# Patient Record
Sex: Female | Born: 1970 | Race: White | Hispanic: No
Health system: Southern US, Community
[De-identification: ages and names within clinical notes are randomized; demographics above are authoritative.]

## PROBLEM LIST (undated history)

## (undated) DIAGNOSIS — I639 Cerebral infarction, unspecified: Secondary | ICD-10-CM

---

## 2007-10-18 ENCOUNTER — Emergency Department (HOSPITAL_COMMUNITY): Admission: EM | Admit: 2007-10-18 | Discharge: 2007-10-18 | Payer: Self-pay | Admitting: Emergency Medicine

## 2010-05-02 ENCOUNTER — Emergency Department (HOSPITAL_COMMUNITY): Payer: BC Managed Care – PPO

## 2010-05-02 ENCOUNTER — Inpatient Hospital Stay (HOSPITAL_COMMUNITY)
Admission: EM | Admit: 2010-05-02 | Discharge: 2010-05-05 | DRG: 530 | Disposition: A | Discharge status: Home / Self Care | Payer: BC Managed Care – PPO | Attending: Neurology | Admitting: Neurology

## 2010-05-02 ENCOUNTER — Inpatient Hospital Stay (HOSPITAL_COMMUNITY): Payer: BC Managed Care – PPO

## 2010-05-02 DIAGNOSIS — Z7982 Long term (current) use of aspirin: Secondary | ICD-10-CM

## 2010-05-02 DIAGNOSIS — Q211 Atrial septal defect: Secondary | ICD-10-CM

## 2010-05-02 DIAGNOSIS — K219 Gastro-esophageal reflux disease without esophagitis: Secondary | ICD-10-CM | POA: Diagnosis present

## 2010-05-02 DIAGNOSIS — J9600 Acute respiratory failure, unspecified whether with hypoxia or hypercapnia: Secondary | ICD-10-CM

## 2010-05-02 DIAGNOSIS — R29898 Other symptoms and signs involving the musculoskeletal system: Secondary | ICD-10-CM | POA: Diagnosis present

## 2010-05-02 DIAGNOSIS — R4701 Aphasia: Secondary | ICD-10-CM | POA: Diagnosis present

## 2010-05-02 DIAGNOSIS — G510 Bell's palsy: Secondary | ICD-10-CM | POA: Diagnosis present

## 2010-05-02 DIAGNOSIS — Q2111 Secundum atrial septal defect: Secondary | ICD-10-CM

## 2010-05-02 DIAGNOSIS — J95821 Acute postprocedural respiratory failure: Secondary | ICD-10-CM | POA: Diagnosis not present

## 2010-05-02 DIAGNOSIS — I6340 Cerebral infarction due to embolism of unspecified cerebral artery: Principal | ICD-10-CM | POA: Diagnosis present

## 2010-05-02 DIAGNOSIS — Z880 Allergy status to penicillin: Secondary | ICD-10-CM

## 2010-05-02 DIAGNOSIS — E785 Hyperlipidemia, unspecified: Secondary | ICD-10-CM | POA: Diagnosis present

## 2010-05-02 DIAGNOSIS — F17200 Nicotine dependence, unspecified, uncomplicated: Secondary | ICD-10-CM | POA: Diagnosis present

## 2010-05-02 DIAGNOSIS — I6350 Cerebral infarction due to unspecified occlusion or stenosis of unspecified cerebral artery: Secondary | ICD-10-CM

## 2010-05-02 LAB — HEMOGLOBIN A1C: Mean Plasma Glucose: 100 mg/dL (ref <117)

## 2010-05-02 LAB — DIFFERENTIAL
Basophils Absolute: 0.1 10*3/uL (ref 0.0–0.1)
Eosinophils Relative: 3 % (ref 0–5)
Monocytes Absolute: 0.5 10*3/uL (ref 0.1–1.0)
Neutro Abs: 4.1 10*3/uL (ref 1.7–7.7)
Neutrophils Relative %: 58 % (ref 43–77)

## 2010-05-02 LAB — CBC
HCT: 39.2 % (ref 36.0–46.0)
Hemoglobin: 13.6 g/dL (ref 12.0–15.0)
MCH: 30.4 pg (ref 26.0–34.0)
RBC: 4.47 MIL/uL (ref 3.87–5.11)
WBC: 7 10*3/uL (ref 4.0–10.5)

## 2010-05-02 LAB — LIPID PANEL
HDL: 46 mg/dL (ref >39)
LDL Cholesterol: 103 mg/dL — ABNORMAL HIGH (ref 0–99)
Triglycerides: 29 mg/dL (ref <150)

## 2010-05-02 LAB — COMPREHENSIVE METABOLIC PANEL
AST: 18 U/L (ref 0–37)
BUN: 9 mg/dL (ref 6–23)
Calcium: 9.6 mg/dL (ref 8.4–10.5)
GFR calc non Af Amer: >60 mL/min (ref >60)
Glucose, Bld: 104 mg/dL — ABNORMAL HIGH (ref 70–99)
Potassium: 3.9 mEq/L (ref 3.5–5.1)
Sodium: 140 mEq/L (ref 135–145)
Total Bilirubin: 0.9 mg/dL (ref 0.3–1.2)

## 2010-05-02 LAB — PROTIME-INR
INR: 0.93 (ref 0.00–1.49)
Prothrombin Time: 12.7 seconds (ref 11.6–15.2)

## 2010-05-02 LAB — RAPID URINE DRUG SCREEN, HOSP PERFORMED
Amphetamines: NOT DETECTED
Barbiturates: NOT DETECTED

## 2010-05-02 LAB — CK TOTAL AND CKMB (NOT AT ARMC)
CK, MB: 1.1 ng/mL (ref 0.3–4.0)
Relative Index: INVALID (ref 0.0–2.5)
Total CK: 68 U/L (ref 7–177)

## 2010-05-02 LAB — BLOOD GAS, ARTERIAL
Acid-base deficit: 2.1 mmol/L — ABNORMAL HIGH (ref 0.0–2.0)
Bicarbonate: 22 mEq/L (ref 20.0–24.0)
FIO2: 0.4 %
O2 Saturation: 99.6 %
PEEP: 5 cmH2O
Patient temperature: 98.6
TCO2: 23.1 mmol/L (ref 0–100)
pO2, Arterial: 214 mmHg — ABNORMAL HIGH (ref 80.0–100.0)

## 2010-05-02 LAB — URINALYSIS, MICROSCOPIC ONLY
Bilirubin Urine: NEGATIVE
Glucose, UA: 100 mg/dL — AB
Ketones, ur: 40 mg/dL — AB
Leukocytes, UA: NEGATIVE
Nitrite: NEGATIVE
Protein, ur: NEGATIVE mg/dL

## 2010-05-02 LAB — GLUCOSE, CAPILLARY
Glucose-Capillary: 126 mg/dL — ABNORMAL HIGH (ref 70–99)
Glucose-Capillary: 139 mg/dL — ABNORMAL HIGH (ref 70–99)

## 2010-05-02 LAB — POCT I-STAT, CHEM 8
BUN: 10 mg/dL (ref 6–23)
Potassium: 3.9 mEq/L (ref 3.5–5.1)
Sodium: 138 mEq/L (ref 135–145)

## 2010-05-02 LAB — APTT: aPTT: 28 seconds (ref 24–37)

## 2010-05-02 MED ORDER — IOHEXOL 300 MG/ML  SOLN
300.0000 mL | Freq: Once | INTRAMUSCULAR | Status: AC | PRN
Start: 2010-05-02 — End: 2010-05-02
  Administered 2010-05-02: 125 mL via INTRA_ARTERIAL

## 2010-05-03 ENCOUNTER — Inpatient Hospital Stay (HOSPITAL_COMMUNITY): Payer: BC Managed Care – PPO

## 2010-05-03 DIAGNOSIS — I6789 Other cerebrovascular disease: Secondary | ICD-10-CM

## 2010-05-03 DIAGNOSIS — I6350 Cerebral infarction due to unspecified occlusion or stenosis of unspecified cerebral artery: Secondary | ICD-10-CM

## 2010-05-03 LAB — GLUCOSE, CAPILLARY
Glucose-Capillary: 85 mg/dL (ref 70–99)
Glucose-Capillary: 90 mg/dL (ref 70–99)

## 2010-05-03 LAB — BASIC METABOLIC PANEL
BUN: 8 mg/dL (ref 6–23)
Calcium: 7.2 mg/dL — ABNORMAL LOW (ref 8.4–10.5)
Chloride: 112 mEq/L (ref 96–112)
Creatinine, Ser: 0.61 mg/dL (ref 0.4–1.2)
GFR calc Af Amer: >60 mL/min (ref >60)

## 2010-05-03 LAB — CBC
Platelets: 203 10*3/uL (ref 150–400)
RBC: 3.5 MIL/uL — ABNORMAL LOW (ref 3.87–5.11)
RDW: 12.5 % (ref 11.5–15.5)
WBC: 15.2 10*3/uL — ABNORMAL HIGH (ref 4.0–10.5)

## 2010-05-03 LAB — MAGNESIUM: Magnesium: 1.8 mg/dL (ref 1.5–2.5)

## 2010-05-03 LAB — SEDIMENTATION RATE: Sed Rate: 14 mm/hr (ref 0–22)

## 2010-05-03 LAB — PHOSPHORUS: Phosphorus: 3 mg/dL (ref 2.3–4.6)

## 2010-05-03 LAB — PROTIME-INR: INR: 1.1 (ref 0.00–1.49)

## 2010-05-03 LAB — HEPARIN LEVEL (UNFRACTIONATED): Heparin Unfractionated: <0.1 IU/mL — ABNORMAL LOW (ref 0.30–0.70)

## 2010-05-04 LAB — GLUCOSE, CAPILLARY: Glucose-Capillary: 80 mg/dL (ref 70–99)

## 2010-05-04 LAB — C4 COMPLEMENT: Complement C4, Body Fluid: 19 mg/dL (ref 16–47)

## 2010-05-04 LAB — C3 COMPLEMENT: C3 Complement: 99 mg/dL (ref 88–201)

## 2010-05-04 LAB — HOMOCYSTEINE: Homocysteine: 8.7 umol/L (ref 4.0–15.4)

## 2010-05-04 NOTE — H&P (Signed)
NAMEKAYZLEE, WIRTANEN             ACCOUNT NO.:  [id number]  MEDICAL RECORD NO.:  [id number]           PATIENT TYPE:  I  LOCATION:  3102                         FACILITY:  [facility]  PHYSICIAN:  Thana Farr, MD    DATE OF BIRTH:  1970/03/08  DATE OF ADMISSION:  05/02/2010 DATE OF DISCHARGE:                             HISTORY & PHYSICAL     ADMISSION DIAGNOSES:  Left hemispheric stroke.  HISTORY OF PRESENT ILLNESS:  Ms. Mayberry is a 40 year old female that awakened this morning normal.  It was noted by her husband at approximately 8 o'clock to have difficulty with speech.  When attempted to stand on, the patient was weak and unable to move her right.  EMS was called at that time.  The patient was brought in as a code stroke. Initial NIH stroke scale was 13.  PAST MEDICAL HISTORY:  Bell palsy.  MEDICATIONS:  None.  ALLERGIES:  No known drug allergies.  FAMILY HISTORY:  The patient's parents are alive and well.  SOCIAL HISTORY:  The patient is a Social research officer, government.  She smokes a pack a week of cigarettes.  Drinks on occasion.  There is no history of illicit drug abuse.  PHYSICAL EXAMINATION:  VITAL SIGNS:  Blood pressure 130/70, heart rate 78, respiratory rate 19, temperature 98.3, O2 sat 99% on room air. NEUROLOGIC:  On mental status testing, the patient is awake.  Speech is dysarthric.  She understands commands at times and other times she does not understand commands and is unable to follow them.  On cranial nerve testing II, disks flat bilaterally.  The patient does not blink to light confrontation.  III, IV, and VI:  Extraocular movements intact.  Pupils reactive bilaterally.  V and VII:  Mild right facial droop.  VIII: Grossly intact.  IX and X:  Decreased gag.  XI and XII:  Tongue deviation to the right on extension.  On motor testing, the patient has 1-2/5 strength in the right upper extremity, 3-/5 in the right lower extremity.  The patient is 5/5 on the left.  On  sensory testing, the patient responds to noxious stimuli bilaterally.  Deep tendon reflexes are 2+ throughout.  Plantars are downgoing on the left and upgoing on the right.  Cerebellar testing is unable to be performed.  TEST RESULTS:  Sodium of 139, potassium of 3.9, chloride 105, bicarb 23, BUN and creatinine 10 and 0.9 respectively, glucose 108, hemoglobin/hematocrit 13.9 and 41.0 respectively.  EKG shows normal sinus rhythm.  CT shows hyperdense left MCA sign.  ASSESSMENT:  Ms. Clayborn is a 40 year old female with acute onset of aphasia and right-sided weakness.  She has a dense middle cerebral artery sign on CT on the left and Dr. Corliss Skains was contacted from Interventional Radiology.  It was determined that the patient would go directly to interventional for a diagnostic angiogram and possible intervention.  PLAN: 1. The patient received two-thirds t-PA now. 2. The patient will be transferred to Interventional Radiology with t-     PA going.  Further intervention to be determined once procedures     performed.  ______________________________ Thana Farr, MD     LR/MEDQ  D:  05/02/2010  T:  05/02/2010  Job:  [id number]  Electronically Signed by Thana Farr MD on 05/04/2010 03:13:10 PM

## 2010-05-05 LAB — GLUCOSE, CAPILLARY: Glucose-Capillary: 87 mg/dL (ref 70–99)

## 2010-05-06 LAB — LUPUS ANTICOAGULANT PANEL
DRVVT: 44.2 secs (ref 36.2–44.3)
Lupus Anticoagulant: NOT DETECTED

## 2010-05-06 LAB — PROTEIN S ACTIVITY: Protein S Activity: 118 % (ref 69–129)

## 2010-05-06 LAB — PROTHROMBIN GENE MUTATION

## 2010-05-06 LAB — PROTEIN C, TOTAL: Protein C, Total: 114 % (ref 72–160)

## 2010-05-06 LAB — PROTEIN C ACTIVITY: Protein C Activity: 176 % — ABNORMAL HIGH (ref 75–133)

## 2010-05-06 LAB — ANA: Anti Nuclear Antibody(ANA): NEGATIVE

## 2010-05-07 LAB — CARDIOLIPIN ANTIBODIES, IGG, IGM, IGA
Anticardiolipin IgG: 5 GPL U/mL — ABNORMAL LOW (ref <23)
Anticardiolipin IgM: 2 MPL U/mL — ABNORMAL LOW (ref <11)

## 2010-05-11 NOTE — Discharge Summary (Signed)
NAMEPERRI, ARAGONES             ACCOUNT NO.:  [id number]  MEDICAL RECORD NO.:  [id number]           PATIENT TYPE:  I  LOCATION:  3021                         FACILITY:  [facility]  PHYSICIAN:  Gamble Enderle P. Pearlean Brownie, MD    DATE OF BIRTH:  Jan 21, 1970  DATE OF ADMISSION:  05/02/2010 DATE OF DISCHARGE:  05/05/2010                              DISCHARGE SUMMARY   ADMISSION DIAGNOSIS:  Left hemispheric infarct.  DISCHARGE DIAGNOSES: 1. 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 2. Mild dyslipidemia. 3. Patent foramen ovale.  HOSPITAL COURSE:  Ms. Kohen is a 40 year old Caucasian lady who developed sudden onset of speech difficulties along with right-sided weakness while talking to her husband.  She presented emergently to [facility] and was found to have initial NIH stroke scale of 13.  CT scan of the head showed a dense left middle cerebral artery sign with no acute abnormalities.  The patient was given two-thirds dose of IV tPA and taken emergently to radiology catheter angiogram which showed occlusion of the left middle cerebral artery in the M1 segment.  After written informed consent was obtained from the patient's husband and family and risks and benefits of acute endovascular stroke treatment were explained, the patient underwent endovascular treatment with Dr. Corliss Skains.  She was given 10 mg of intraarterial tPA along with mechanical thrombectomy with one pass of Solitaire device, which resulted in complete recanalization.  Postprocedure, CT scan showed no evidence of hemorrhage.  The patient was admitted to the Neurological Intensive Care Unit where blood pressure was  tightly controlled.  She was found to be awake and following commands.  She was extubated and the groin sheet was  discontinued.  She was found to have significant improvement in her right-sided strength as well as speech.  Rest of her stroke workup showed urine drug screen was negative except for benzodiazepine.  Hemoglobin A1c was normal at 5.1.  Total cholesterol was normal at 105, triglycerides 29, HDL 46, and LDL was borderline at 103 mg percent.  Admission electrolytes, white count were normal. Hypercoagulable panel labs were sent, but all of the results were not back at the time of discharge.  Only, antithrombin III and homocystine were normal.  RPR was negative.  HIV was negative.  Complement levels and ESR were normal.  The patient underwent transesophageal echocardiogram which showed normal ejection fraction, but showed a small patent foramen ovale.  Lower extremity venous Doppler was obtained which showed no evidence of deep vein thrombosis.  Carotid ultrasound showed no significant extracranial stenosis.  The etiology of the patient's stroke was determined to be cryptogenic, but since the patient remained excellent clinical recovery she was seen by Physical Therapy and was thought to require only outpatient therapy needs.  She was started on  aspirin for stroke prevention as well as on Crestor 2.5 mg a day.  She was discharged home in stable condition and advised to follow up with Dr. Pearlean Brownie as an outpatient in 2-3 weeks for transcranial Doppler bubble study and emboli monitoring and at that time arrangements will be made for outpatient prolonged cardiac telemetry as well.  She was advised to resume work in about 2 weeks.  DISCHARGE MEDICATIONS: 1. Aspirin 325 mg a day. 2. Crestor 2.5 mg a day.  At the time of discharge, NIH stroke scale had improved from 13 on the admission to 1 with only occasional word hesitancy and word-finding difficulty and no focal weakness on right  side.     Notnamed Scholz P. Pearlean Brownie, MD     PPS/MEDQ  D:  05/05/2010  T:  05/05/2010  Job:  [id number]  Electronically Signed by Delia Heady MD on 05/11/2010 01:50:43 PM

## 2021-02-13 ENCOUNTER — Encounter (HOSPITAL_COMMUNITY): Payer: Self-pay | Admitting: Emergency Medicine

## 2021-02-13 ENCOUNTER — Emergency Department (HOSPITAL_COMMUNITY)
Admission: EM | Admit: 2021-02-13 | Discharge: 2021-02-13 | Disposition: A | Discharge status: Home / Self Care | Payer: BC Managed Care – PPO | Attending: Emergency Medicine | Admitting: Emergency Medicine

## 2021-02-13 ENCOUNTER — Emergency Department (HOSPITAL_COMMUNITY): Payer: BC Managed Care – PPO

## 2021-02-13 ENCOUNTER — Other Ambulatory Visit: Payer: Self-pay

## 2021-02-13 DIAGNOSIS — R531 Weakness: Secondary | ICD-10-CM | POA: Diagnosis not present

## 2021-02-13 DIAGNOSIS — Z7982 Long term (current) use of aspirin: Secondary | ICD-10-CM | POA: Insufficient documentation

## 2021-02-13 DIAGNOSIS — R208 Other disturbances of skin sensation: Secondary | ICD-10-CM | POA: Diagnosis not present

## 2021-02-13 DIAGNOSIS — M5412 Radiculopathy, cervical region: Secondary | ICD-10-CM | POA: Diagnosis not present

## 2021-02-13 DIAGNOSIS — R0789 Other chest pain: Secondary | ICD-10-CM | POA: Diagnosis present

## 2021-02-13 HISTORY — DX: Cerebral infarction, unspecified: I63.9

## 2021-02-13 LAB — CBC
HCT: 37 % (ref 36.0–46.0)
Hemoglobin: 12.4 g/dL (ref 12.0–15.0)
MCH: 29.7 pg (ref 26.0–34.0)
MCHC: 33.5 g/dL (ref 30.0–36.0)
MCV: 88.7 fL (ref 80.0–100.0)
Platelets: 255 10*3/uL (ref 150–400)
RBC: 4.17 MIL/uL (ref 3.87–5.11)
RDW: 12.2 % (ref 11.5–15.5)
WBC: 8 10*3/uL (ref 4.0–10.5)
nRBC: 0 % (ref 0.0–0.2)

## 2021-02-13 LAB — BASIC METABOLIC PANEL
Anion gap: 10 (ref 5–15)
BUN: 9 mg/dL (ref 6–20)
CO2: 24 mmol/L (ref 22–32)
Calcium: 8.7 mg/dL — ABNORMAL LOW (ref 8.9–10.3)
Chloride: 102 mmol/L (ref 98–111)
Creatinine, Ser: 0.69 mg/dL (ref 0.44–1.00)
GFR, Estimated: >60 mL/min (ref >60)
Glucose, Bld: 91 mg/dL (ref 70–99)
Potassium: 3.9 mmol/L (ref 3.5–5.1)
Sodium: 136 mmol/L (ref 135–145)

## 2021-02-13 LAB — TROPONIN I (HIGH SENSITIVITY)
Troponin I (High Sensitivity): 3 ng/L (ref <18)
Troponin I (High Sensitivity): 3 ng/L (ref <18)

## 2021-02-13 MED ORDER — PREDNISONE 50 MG PO TABS: 50.0000 mg | ORAL_TABLET | Freq: Every day | ORAL | 0 refills | Status: AC

## 2021-02-13 NOTE — ED Triage Notes (Signed)
Patient complains of left arm tingling and chest tightness that started a few days, pain is worse when laying down. Patient denies nausea, vomiting, and shortness of breath. Patient is alert, oriented, and in no apparent distress at this time.

## 2021-02-13 NOTE — ED Provider Triage Note (Signed)
Emergency Medicine Provider Triage Evaluation Note  Kaylee Guerrero , a 51 y.o. female  was evaluated in triage.  Pt complains of left-sided chest pain, left arm pain/ numbness for the last 3 to 4 days.  Patient does have a history of stroke in the past which presented as right-sided numbness.  Pain is worse when she lays back.  Review of Systems  Positive:  Negative: See above   Physical Exam  BP (!) 149/97 (BP Location: Right Arm)    Pulse 88    Temp 98.7 F (37.1 C) (Oral)    Resp 16    SpO2 96%  Gen:   Awake, no distress   Resp:  Normal effort  MSK:   Moves extremities without difficulty  Other:  3/6 holosystolic murmur present  Medical Decision Making  Medically screening exam initiated at 12:26 PM.  Appropriate orders placed.  Kaylee Guerrero was informed that the remainder of the evaluation will be completed by another provider, this initial triage assessment does not replace that evaluation, and the importance of remaining in the ED until their evaluation is complete.     Myna Bright Vaughn, [place] 02/13/21 1227

## 2021-02-13 NOTE — ED Notes (Signed)
Pt has returned from MRI. 

## 2021-02-13 NOTE — ED Provider Notes (Signed)
[facility] EMERGENCY DEPARTMENT Provider Note   CSN: [id number] Arrival date & time: 02/13/21  1148     History  Chief Complaint  Patient presents with   Chest Pain    Kaylee Guerrero is a 51 y.o. female with history of embolic stroke of the M1 segment of the left MCA having undergone intravascular thrombectomy and directed tPA in 2012 with Dr. Corliss Skains who presents with concern for 3 days of intermittent pain shooting down the left arm with associated paresthesias and intermittent numbness of the entire left hand rendering her grip diminished and having difficulty holding the object in the hand.  She has associated left-sided chest pressure without shortness of breath, radiation of the pain, palpitations, or syncopal episodes.  She accompanied at the bedside by her Partner with whom she lives.  He denies any recent confusion for her.  No recent flulike symptoms or fevers.  No history of trauma recently or recent or remote history of IV drug use.  No immunocompromising condition.  She does have history of small patent foramen ovale which was determined to be the etiology of her embolic stroke in the past.  She is on daily aspirin but is not on any antiplatelet therapy or anticoagulation.  HPI     Home Medications Prior to Admission medications   Not on File      Allergies    Codeine, Penicillins, and Shellfish allergy    Review of Systems   Review of Systems  Constitutional: Negative.   HENT: Negative.    Respiratory:  Positive for chest tightness.   Cardiovascular:  Positive for chest pain. Negative for palpitations and leg swelling.  Gastrointestinal: Negative.   Musculoskeletal:  Positive for myalgias.  Neurological:  Positive for numbness. Negative for dizziness, tremors, syncope, facial asymmetry, light-headedness and headaches.   Physical Exam Updated Vital Signs BP (!) 145/98 (BP Location: Right Arm)    Pulse 77    Temp 98.7 F (37.1 C) (Oral)     Resp (!) 22    SpO2 99%  Physical Exam Vitals and nursing note reviewed.  Constitutional:      Appearance: She is not ill-appearing or toxic-appearing.  HENT:     Head: Normocephalic and atraumatic.     Mouth/Throat:     Mouth: Mucous membranes are moist.     Pharynx: No oropharyngeal exudate or posterior oropharyngeal erythema.  Eyes:     General: Lids are normal. Vision grossly intact.        Right eye: No discharge.        Left eye: No discharge.     Extraocular Movements: Extraocular movements intact.     Conjunctiva/sclera: Conjunctivae normal.     Pupils: Pupils are equal, round, and reactive to light.     Visual Fields: Right eye visual fields normal and left eye visual fields normal.  Cardiovascular:     Rate and Rhythm: Normal rate and regular rhythm.     Pulses: Normal pulses.     Heart sounds: Normal heart sounds. No murmur heard. Pulmonary:     Effort: Pulmonary effort is normal. No respiratory distress.     Breath sounds: Normal breath sounds. No wheezing or rales.  Abdominal:     General: Bowel sounds are normal. There is no distension.     Tenderness: There is no abdominal tenderness.  Musculoskeletal:        General: No deformity.     Cervical back: Neck supple.  Right lower leg: No tenderness.     Left lower leg: No tenderness.  Skin:    General: Skin is warm and dry.     Capillary Refill: Capillary refill takes less than 2 seconds.  Neurological:     Mental Status: She is alert and oriented to person, place, and time. Mental status is at baseline.     GCS: GCS eye subscore is 4. GCS verbal subscore is 5. GCS motor subscore is 6.     Cranial Nerves: Cranial nerves 2-12 are intact.     Sensory: Sensory deficit present.     Motor: Weakness present. No tremor, atrophy, seizure activity or pronator drift.     Coordination: Coordination is intact.     Gait: Gait is intact.     Comments: 5/5 grip strength on the right with 4/5 grip strength in the left and  normal sensation to fine touch in the hand at time of my exam. Grip strength exam does seem to be effort limited secondary to worsening parasthesias with strong grip.  Patient does have residual right-sided facial sensation deficit since her stroke in 2012, unchanged per patient recently. No pronator drift, normal coordination and ambulation in the emergency department.  Psychiatric:        Mood and Affect: Mood normal.    ED Results / Procedures / Treatments   Labs (all labs ordered are listed, but only abnormal results are displayed) Labs Reviewed  BASIC METABOLIC PANEL - Abnormal; Notable for the following components:      Result Value   Calcium 8.7 (*)    All other components within normal limits  CBC  TROPONIN I (HIGH SENSITIVITY)  TROPONIN I (HIGH SENSITIVITY)    EKG EKG Interpretation  Date/Time:  Wednesday February 13 2021 11:55:55 EST Ventricular Rate:  89 PR Interval:  176 QRS Duration: 74 QT Interval:  332 QTC Calculation: 403 R Axis:   70 Text Interpretation: Normal sinus rhythm Normal ECG When compared with ECG of 02-May-2010 16:14, PREVIOUS ECG IS PRESENT No significant change since last tracing Confirmed by Benjiman Core [phone]) on 02/13/2021 3:06:25 PM  Radiology DG Chest 2 View  Result Date: 02/13/2021 CLINICAL DATA:  Chest pain EXAM: CHEST - 2 VIEW COMPARISON:  Chest x-ray dated May 03, 2010 FINDINGS: The heart size and mediastinal contours are within normal limits. Both lungs are clear. The visualized skeletal structures are unremarkable. IMPRESSION: No active cardiopulmonary disease. Electronically Signed   By: Allegra Lai M.D.   On: 02/13/2021 12:43   CT Head Wo Contrast  Result Date: 02/13/2021 CLINICAL DATA:  51 year old female with history of left arm numbness. EXAM: CT HEAD WITHOUT CONTRAST TECHNIQUE: Contiguous axial images were obtained from the base of the skull through the vertex without intravenous contrast. RADIATION DOSE REDUCTION: This  exam was performed according to the departmental dose-optimization program which includes automated exposure control, adjustment of the mA and/or kV according to patient size and/or use of iterative reconstruction technique. COMPARISON:  Head CT 05/02/2010. FINDINGS: Brain: Large region of low attenuation involving the lateral aspect of the left caudate head, anterior left globus pallidus and putamen, and the adjacent white matter tracks in the left frontal lobe, compatible with encephalomalacia from a remote infarct. More ill-defined area of low attenuation in the subcortical white matter of the left parietal region (axial image 20 of series 3) which could represent age-indeterminate ischemia. No evidence of acute hemorrhage, hydrocephalus, extra-axial collection or mass lesion/mass effect. Vascular: No hyperdense vessel or unexpected  calcification. Skull: Normal. Negative for fracture or focal lesion. Sinuses/Orbits: No acute finding. Other: None. IMPRESSION: 1. In addition to an old left-sided infarct involving the basal ganglia and adjacent white matter tracks, there is an area of age-indeterminate (potentially subacute) ischemia in the left parietal subcortical white matter, as above. If there is clinical concern for acute ischemia, further evaluation with brain MRI could be considered. Critical Value/emergent results were called by telephone at the time of interpretation on 02/13/2021 at 2:05 pm to provider PA Jodi Geralds, who verbally acknowledged these results. Electronically Signed   By: Trudie Reed M.D.   On: 02/13/2021 14:06   MR BRAIN WO CONTRAST  Result Date: 02/13/2021 CLINICAL DATA:  Neuro deficit, acute, stroke suspected EXAM: MRI HEAD WITHOUT CONTRAST TECHNIQUE: Multiplanar, multiecho pulse sequences of the brain and surrounding structures were obtained without intravenous contrast. COMPARISON:  CT head from the same day.  MRI May 03, 2010. FINDINGS: Brain: No acute infarction, hemorrhage,  hydrocephalus, extra-axial collection or mass lesion. Remote infarcts in the left basal ganglia and left parietal lobe. Ex vacuo ventricular dilation involving the frontal horn of the left lateral ventricle. Couple punctate T2/FLAIR hyperintensities elsewhere in the brain, nonspecific but compatible with chronic microvascular ischemic disease. Vascular: Major arterial flow voids are maintained at the skull base. Skull and upper cervical spine: Normal marrow signal. Sinuses/Orbits: Minimal paranasal sinus mucosal thickening. Unremarkable orbits. Other: No mastoid effusions. IMPRESSION: 1. No evidence of acute intracranial abnormality. 2. Remote infarcts in the left basal ganglia and left parietal lobe. Electronically Signed   By: Feliberto Harts M.D.   On: 02/13/2021 17:36   MR Cervical Spine Wo Contrast  Result Date: 02/13/2021 CLINICAL DATA:  Left arm tingling for the past few days. EXAM: MRI CERVICAL SPINE WITHOUT CONTRAST TECHNIQUE: Multiplanar, multisequence MR imaging of the cervical spine was performed. No intravenous contrast was administered. COMPARISON:  None. FINDINGS: Alignment: Mild reversal of the normal cervical lordosis. No significant listhesis. Vertebrae: No fracture, evidence of discitis, or bone lesion. Mild left-sided degenerative endplate marrow edema at C7-T1. Cord: Normal signal and morphology. Posterior Fossa, vertebral arteries, paraspinal tissues: Negative. Disc levels: C2-C3:  Negative. C3-C4:  Mild disc bulging.  No stenosis. C4-C5: Mild disc bulging and bilateral uncovertebral hypertrophy. Borderline mild spinal canal stenosis. Moderate right and mild left neuroforaminal stenosis. C5-C6: Mild disc bulging and right uncovertebral hypertrophy. Mild bilateral neuroforaminal stenosis. No spinal canal stenosis. C6-C7: Mild disc bulging. Severe bilateral uncovertebral hypertrophy. Severe bilateral neuroforaminal stenosis. No spinal canal stenosis. C7-T1: Left eccentric disc bulging. Mild  bilateral facet uncovertebral hypertrophy. Moderate left and mild right neuroforaminal stenosis. No spinal canal stenosis. IMPRESSION: 1. Multilevel cervical spondylosis as described above. Severe bilateral neuroforaminal stenosis at C6-C7. 2. Moderate left neuroforaminal stenosis at C7-T1. Electronically Signed   By: Obie Dredge M.D.   On: 02/13/2021 17:22    Procedures Procedures    Medications Ordered in ED Medications - No data to display  ED Course/ Medical Decision Making/ A&P                           Medical Decision Making 51 year old female presents with concern for intermittent paresthesias and pain shooting down the left arm.  Differential diagnosis includes is not limited to CVA, cervical radiculopathy, musculoskeletal injury, peripheral neuropathy.  Hypertensive on intake vital signs otherwise normal.  Cardiopulmonary exam is normal, abdominal exam is benign.  Neuro exam as above with mild grip strength deficit on  the left relative to the right.  Baseline residual sensory deficit in the right face. Chest pain work-up also initiated in triage due to concern for some associated left-sided chest pressure.  Amount and/or Complexity of Data Reviewed Labs: ordered.    Details: CBC and BMP are unremarkable, troponin is normal. Radiology: ordered.    Details: Chest x-ray without acute cardiopulmonary disease.  CT head with concern for possible subacute ischemia in the left parietal subcortical white matter in addition to old left-sided infarct. MRI brain  and C-spine were obtained as well.  MRI brain without evidence of acute intracranial abnormality; previously seen remote infarcts in the left.  MRI C-spine with multilevel cervical spondylosis and severe bilateral neuroforaminal stenosis at C6 and C7.    Clinical picture and work-up most consistent with cervical radiculopathy likely contributed to by cervical spinal stenosis. Will plan to discharge patient with steroid burst  and plan for outpatient neurosurgical follow-up.  No indication for emergent neurosurgical follow-up given reassuring physical exam at this time.  Jourdyn voiced understanding of her medical evaluation and treatment plan.  Her questions were answered to her expressed satisfaction.  Strict return precautions were given.  Patient is well-appearing, stable, and was discharged in good condition.  This chart was dictated using voice recognition software, Dragon. Despite the best efforts of this provider to proofread and correct errors, errors may still occur which can change documentation meaning.  Final Clinical Impression(s) / ED Diagnoses Final diagnoses:  None    Rx / DC Orders ED Discharge Orders     None         Sherrilee GillesSponseller, Darry Kelnhofer R, PA-C 02/13/21 1755    Benjiman CorePickering, Nathan, MD 02/13/21 2328

## 2021-02-13 NOTE — ED Notes (Signed)
Pt has not returned from MRI 

## 2021-02-13 NOTE — Discharge Instructions (Signed)
You are seen here today for the pain and tingling in your left arm.  He did not have any evidence of stroke on your work-up but you do have some evidence of inflammation and likely impingement of the nerves in your cervical spine.  For this reason you to follow-up with the neurosurgeon listed below.  You have been prescribed steroids to take for the next 5 days to hopefully improve your symptoms.  Please call Dr. Sueanne Margarita office first thing tomorrow morning to schedule follow-up appointment for cervical spinal stenosis with numbness and tingling in your hand.  Return to the ER for develop any new severe symptoms.

## 2021-02-13 NOTE — ED Notes (Signed)
Patient transported to MRI 

## 2021-03-06 ENCOUNTER — Other Ambulatory Visit: Payer: Self-pay

## 2021-03-06 ENCOUNTER — Ambulatory Visit: Payer: BC Managed Care – PPO | Attending: Neurosurgery | Admitting: Physical Therapy

## 2021-03-06 ENCOUNTER — Encounter: Payer: Self-pay | Admitting: Physical Therapy

## 2021-03-06 DIAGNOSIS — M6281 Muscle weakness (generalized): Secondary | ICD-10-CM | POA: Diagnosis present

## 2021-03-06 DIAGNOSIS — R29898 Other symptoms and signs involving the musculoskeletal system: Secondary | ICD-10-CM | POA: Diagnosis present

## 2021-03-06 DIAGNOSIS — M542 Cervicalgia: Secondary | ICD-10-CM | POA: Diagnosis present

## 2021-03-06 DIAGNOSIS — R293 Abnormal posture: Secondary | ICD-10-CM | POA: Diagnosis present

## 2021-03-06 NOTE — Therapy (Signed)
[facility] [address] [place], [place], [address] Phone: [phone]   Fax:  [phone]  Physical Therapy Evaluation  Patient Details  Name: Kaylee Guerrero MRN: [id number] Date of Birth: January 22, 1970 Referring Provider (PT): Coletta Memos   Encounter Date: 03/06/2021   PT End of Session - 03/06/21 1110     Visit Number 1    Number of Visits 13    Date for PT Re-Evaluation 04/17/21    Authorization Type BCBS    Authorization Time Period 03/06/21 to 04/17/21    Authorization - Number of Visits 20    PT Start Time 1015    PT Stop Time 1059    PT Time Calculation (min) 44 min    Activity Tolerance Patient tolerated treatment well    Behavior During Therapy [facility] for tasks assessed/performed             Past Medical History:  Diagnosis Date   Stroke [facility])     History reviewed. No pertinent surgical history.  There were no vitals filed for this visit.    Subjective Assessment - 03/06/21 1015     Subjective My problem started February 2nd or 3rd, I went to the ED on the 8th. It started hurting one day and got worse and worse. I was told I had spinal stenosis in part of my neck, they think there's swelling around the nerves in my neck. I had a stroke in 2012 and have some sensory issues with my RUE but this is minor to the symptoms in my L UE. My fingers just won't stop tingling. Right handed but having issues with grip on the left side. Don't notice it as much when my arm is hanging straight down, pain is more when I have my arm bent or when I'm laying down to sleep    Patient Stated Goals be in less pain, get rid of tingling    Currently in Pain? Yes    Pain Score 6     Pain Location Arm    Pain Orientation Left    Pain Descriptors / Indicators Burning;Tingling;Numbness;Shooting    Pain Type Acute pain    Pain Radiating Towards down L UE to 2nd and 3rd digits worst, also has some numbness in other digits but not as  bad    Pain Onset 1 to 4 weeks ago    Pain Frequency Constant    Aggravating Factors  worse when laying down, sometimes driving can make it worse/trying to grip steering wheel or having arm at a bent angle    Pain Relieving Factors alieve usually helps, being up and about can help some    Effect of Pain on Daily Activities severe                [facility] PT Assessment - 03/06/21 0001       Assessment   Medical Diagnosis cervical spondylosis    Referring Provider (PT) Coletta Memos    Onset Date/Surgical Date --   early February   Next MD Visit [place] early April    Prior Therapy none      Precautions   Precautions Other (comment)    Precaution Comments 15-20# lifting restriction (this is most as she can tolerate due to pain)      Restrictions   Weight Bearing Restrictions No      Balance Screen   Has the patient fallen in the past 6 months No    Has  the patient had a decrease in activity level because of a fear of falling?  No    Is the patient reluctant to leave their home because of a fear of falling?  No      Home Tourist information centre manager residence      Prior Function   Level of Independence Independent;Independent with basic ADLs    Vocation Full time employment    Vocation Requirements overnight stocking team lead    Leisure not much- very busy      Observation/Other Assessments   Observations cervical compression test no increase in pain either side, some relief of pian with cervical traction; ROOS test heavily positive after just 30 seconds    Focus on Therapeutic Outcomes (FOTO)  67.9      ROM / Strength   AROM / PROM / Strength AROM;Strength      AROM   AROM Assessment Site Shoulder;Cervical;Thoracic    Right/Left Shoulder Left;Right    Right Shoulder Flexion --   WNL   Right Shoulder ABduction --   WNL   Right Shoulder Internal Rotation --   FIR T7   Right Shoulder External Rotation --   FER T4   Left Shoulder Flexion --   WNL   Left  Shoulder ABduction --   WNL   Left Shoulder Internal Rotation --   FIR T7   Left Shoulder External Rotation --   FER T4   Cervical Flexion WNL increased pain    Cervical Extension WNL    Cervical - Right Side Bend moderate limitation    Cervical - Left Side Bend moderate limitation    Cervical - Right Rotation WNL    Cervical - Left Rotation WNL increased pain    Thoracic Flexion WNL    Thoracic Extension mild limitation    Thoracic - Right Side Bend WNL    Thoracic - Left Side Bend WNL    Thoracic - Right Rotation mild limitation    Thoracic - Left Rotation mild limitation      Strength   Overall Strength Comments L grip strength markedly weaker; difficulty with finger opposition with L hand    Strength Assessment Site Shoulder;Cervical;Thoracic;Elbow    Right/Left Shoulder Right;Left    Right Shoulder Flexion 4/5    Right Shoulder Extension 3+/5    Right Shoulder ABduction 4/5    Right Shoulder Internal Rotation 4+/5    Right Shoulder External Rotation 4+/5    Left Shoulder Flexion 4-/5    Left Shoulder Extension 3+/5    Left Shoulder ABduction 4-/5    Left Shoulder Internal Rotation 4+/5    Left Shoulder External Rotation 4+/5    Right/Left Elbow Right;Left    Right Elbow Flexion 4+/5    Right Elbow Extension 4-/5    Left Elbow Flexion 4/5    Left Elbow Extension 3+/5      Palpation   Palpation comment lots of trigger points in B UT and down L UE; seems to be hypersensitivity in LUE possibly from nerve involvement                        Objective measurements completed on examination: See above findings.       [facility] Adult PT Treatment/Exercise - 03/06/21 0001       Exercises   Exercises Neck      Neck Exercises: Seated   Neck Retraction 10 reps;3 secs    Lateral Flexion Both;10 reps  Lateral Flexion Limitations with chin tuck    Other Seated Exercise scap retractions 1x10 3 second holds      Neck Exercises: Stretches   Upper Trapezius  Stretch 2 reps;30 seconds                     PT Education - 03/06/21 1109     Education Details exam findings, POC, HEP; thoracic outlet syndrome  and PT ability to intervene/treat    Person(s) Educated Patient    Methods Explanation;Demonstration;Handout    Comprehension Verbalized understanding;Returned demonstration              PT Short Term Goals - 03/06/21 1116       PT SHORT TERM GOAL #1   Title Will be compliant with appropriate HEP to be progressed PRN    Time 3    Period Weeks    Status New    Target Date 03/27/21      PT SHORT TERM GOAL #2   Title Pain and numbness in LUE to have improved by 25% to show improvement in condition    Time 3    Period Weeks    Status New      PT SHORT TERM GOAL #3   Title Will have good understanding of posture and desk ergonomics    Time 3    Period Weeks    Status New      PT SHORT TERM GOAL #4   Title Will report 25% reduction in symptoms and when reaching overhead    Time 3    Period Weeks    Status New               PT Long Term Goals - 03/06/21 1122       PT LONG TERM GOAL #1   Title MMT to improve by at least  1 grade in all weak groups to show improving functional strength    Time 6    Period Weeks    Status New    Target Date 04/17/21      PT LONG TERM GOAL #2   Title Pain and symptoms in L UE to have improved by at least 50% to show improvement in condition    Time 6    Period Weeks    Status New      PT LONG TERM GOAL #3   Title Will be able to lift at least 30-35# overhead without increase in pain    Time 6    Period Weeks    Status New      PT LONG TERM GOAL #4   Title Pain to be no more than 2/10 in order to improve QOL, work task tolerance, and sleep patterns    Time 6    Period Weeks    Status New                    Plan - 03/06/21 1112     Clinical Impression Statement Aline BrochureChristi arrives today having quite a bit of trouble with L UE pain and numbness which  started in early February; she did not have any injuries or odd events that led to her pain, started insidiously. Of note, her pain is better when laying down and when she has her arm by her side, much worse when her arm is elevated in any way which does hint at vascular involvement. Exam shows significant weakness in LUE, postural impairment, and all tests were  positive for thoracic outlet syndrome today as well. Will benefit from skilled PT services to address functional impairments, reduce pain, and optimize level of function.    Personal Factors and Comorbidities Fitness;Comorbidity 1;Profession;Time since onset of injury/illness/exacerbation    Comorbidities h/o cva    Examination-Activity Limitations Reach Overhead;Bed Mobility;Self Feeding;Caring for Others;Sleep;Dressing;Hygiene/Grooming;Toileting;Lift    Examination-Participation Restrictions Occupation;Cleaning;Community Activity;Driving;Laundry;Yard Work    Conservation officer, historic buildings Evolving/Moderate complexity    Clinical Decision Making Moderate    Rehab Potential Good    PT Frequency 2x / week    PT Duration 6 weeks    PT Treatment/Interventions ADLs/Self Care Home Management;Cryotherapy;Electrical Stimulation;Iontophoresis 4mg /ml Dexamethasone;Moist Heat;Traction;Ultrasound;Functional mobility training;Therapeutic activities;Therapeutic exercise;Balance training;Neuromuscular re-education;Patient/family education;Manual techniques;Scar mobilization;Passive range of motion;Dry needling;Energy conservation;Taping    PT Next Visit Plan how is HEP going? postural training, strengthening as tolerated. Try first rib mobs and STM/DN to scalenes. Traction as tolerated (manual or machine)    PT Home Exercise Plan    Consulted and Agree with Plan of Care Patient             Patient will benefit from skilled therapeutic intervention in order to improve the following deficits and impairments:  Increased fascial restricitons,  Increased muscle spasms, Impaired UE functional use, Decreased activity tolerance, Pain, Impaired flexibility, Improper body mechanics, Decreased strength, Increased edema, Impaired sensation, Postural dysfunction  Visit Diagnosis: Cervicalgia  Abnormal posture  Muscle weakness (generalized)  Other symptoms and signs involving the musculoskeletal system     Problem List There are no problems to display for this patient.  [id number] PT, DPT, PN2   Supplemental Physical Therapist [place]       [facility] [address] [place], [place], [place] Phone: [phone]   Fax:  [phone]  Name: MURLEAN SEELYE MRN: Hoover Browns Date of Birth: 18-May-1970

## 2021-03-13 ENCOUNTER — Other Ambulatory Visit: Payer: Self-pay

## 2021-03-13 ENCOUNTER — Encounter: Payer: Self-pay | Admitting: Physical Therapy

## 2021-03-13 ENCOUNTER — Ambulatory Visit: Payer: BC Managed Care – PPO | Admitting: Physical Therapy

## 2021-03-13 DIAGNOSIS — R293 Abnormal posture: Secondary | ICD-10-CM

## 2021-03-13 DIAGNOSIS — M542 Cervicalgia: Secondary | ICD-10-CM | POA: Diagnosis not present

## 2021-03-13 NOTE — Therapy (Signed)
[place] ?[place] ?[address] ?[place], [place], [address] ?Phone: [phone]   Fax:  [phone] ? ?Physical Therapy Treatment ? ?Patient Details  ?Name: Kaylee Guerrero ?MRN: [id number] ?Date of Birth: 03-26-1970 ?Referring Provider (PT): Ashok Pall ? ? ?Encounter Date: 03/13/2021 ? ? PT End of Session - 03/13/21 1057   ? ? Visit Number 2   ? Date for PT Re-Evaluation 04/17/21   ? Authorization Time Period 03/06/21 to 04/17/21   ? PT Start Time 1015   ? PT Stop Time 1057   ? PT Time Calculation (min) 42 min   ? Activity Tolerance Patient tolerated treatment well   ? Behavior During Therapy [facility] for tasks assessed/performed   ? ?  ?  ? ?  ? ? ?Past Medical History:  ?Diagnosis Date  ? Stroke [facility])   ? ? ?History reviewed. No pertinent surgical history. ? ?There were no vitals filed for this visit. ? ? Subjective Assessment - 03/13/21 1018   ? ? Subjective numbness and tingling with the L fingers. Has been doing HEP.   ? Currently in Pain? Yes   ? Pain Score 3    ? Pain Location Hand   ? Pain Orientation Left   ? ?  ?  ? ?  ? ? ? ? ? ? ? ? ? ? ? ? ? ? ? ? ? ? ? ? [facility] Adult PT Treatment/Exercise - 03/13/21 0001   ? ?  ? Neck Exercises: Machines for Strengthening  ? Nustep L3 x 6 min   ?  ? Neck Exercises: Standing  ? Other Standing Exercises ext red 2x10   ?  ? Neck Exercises: Seated  ? Neck Retraction 10 reps;3 secs   x2 yellow tband  ? Other Seated Exercise ER red 2x10   ? Other Seated Exercise rows red 2x10   ?  ? Manual Therapy  ? Manual Therapy Soft tissue mobilization;Passive ROM;Manual Traction   ? Soft tissue mobilization cervical para spinales, UT, scalenes   ? Passive ROM Cervical spine all directions with hend range holds   ? Manual Traction cervical spine 5 x30''   ? ?  ?  ? ?  ? ? ? ? ? ? ? ? ? ? ? ? PT Short Term Goals - 03/06/21 1116   ? ?  ? PT SHORT TERM GOAL #1  ? Title Will be compliant with appropriate HEP to be progressed PRN   ? Time 3   ? Period  Weeks   ? Status New   ? Target Date 03/27/21   ?  ? PT SHORT TERM GOAL #2  ? Title Pain and numbness in LUE to have improved by 25% to show improvement in condition   ? Time 3   ? Period Weeks   ? Status New   ?  ? PT SHORT TERM GOAL #3  ? Title Will have good understanding of posture and desk ergonomics   ? Time 3   ? Period Weeks   ? Status New   ?  ? PT SHORT TERM GOAL #4  ? Title Will report 25% reduction in symptoms and when reaching overhead   ? Time 3   ? Period Weeks   ? Status New   ? ?  ?  ? ?  ? ? ? ? PT Long Term Goals - 03/06/21 1122   ? ?  ? PT LONG TERM GOAL #1  ? Title  MMT to improve by at least  1 grade in all weak groups to show improving functional strength   ? Time 6   ? Period Weeks   ? Status New   ? Target Date 04/17/21   ?  ? PT LONG TERM GOAL #2  ? Title Pain and symptoms in L UE to have improved by at least 50% to show improvement in condition   ? Time 6   ? Period Weeks   ? Status New   ?  ? PT LONG TERM GOAL #3  ? Title Will be able to lift at least 30-35# overhead without increase in pain   ? Time 6   ? Period Weeks   ? Status New   ?  ? PT LONG TERM GOAL #4  ? Title Pain to be no more than 2/10 in order to improve QOL, work task tolerance, and sleep patterns   ? Time 6   ? Period Weeks   ? Status New   ? ?  ?  ? ?  ? ? ? ? ? ? ? ? Plan - 03/13/21 1057   ? ? Clinical Impression Statement Pt tolerated an initial progression to TE well evident by no subjective reports of increase pain. Postural cues needed with posterior strengthening interventions. She did report some bilateral UE fatigue with the interventions. No issues with MT, she did report some increase in L hand tingling with cervical side bend to the R. Improved tissue elasticity after MT>   ? Personal Factors and Comorbidities Fitness;Comorbidity 1;Profession;Time since onset of injury/illness/exacerbation   ? Comorbidities h/o cva   ? Examination-Activity Limitations Reach Overhead;Bed Mobility;Self Feeding;Caring for  Others;Sleep;Dressing;Hygiene/Grooming;Toileting;Lift   ? Examination-Participation Restrictions Occupation;Cleaning;Community Activity;Driving;Laundry;Valla Leaver Work   ? Stability/Clinical Decision Making Evolving/Moderate complexity   ? Rehab Potential Good   ? PT Frequency 2x / week   ? PT Treatment/Interventions ADLs/Self Care Home Management;Cryotherapy;Electrical Stimulation;Iontophoresis 4mg /ml Dexamethasone;Moist Heat;Traction;Ultrasound;Functional mobility training;Therapeutic activities;Therapeutic exercise;Balance training;Neuromuscular re-education;Patient/family education;Manual techniques;Scar mobilization;Passive range of motion;Dry needling;Energy conservation;Taping   ? PT Next Visit Plan postural training, strengthening as tolerated. Try first rib mobs and STM/DN to scalenes. Traction as tolerated (manual or machine)   ? ?  ?  ? ?  ? ? ?Patient will benefit from skilled therapeutic intervention in order to improve the following deficits and impairments:  Increased fascial restricitons, Increased muscle spasms, Impaired UE functional use, Decreased activity tolerance, Pain, Impaired flexibility, Improper body mechanics, Decreased strength, Increased edema, Impaired sensation, Postural dysfunction ? ?Visit Diagnosis: ?Cervicalgia ? ?Abnormal posture ? ? ? ? ?Problem List ?There are no problems to display for this patient. ? ? ?Scot Jun, PTA ?03/13/2021, 11:00 AM ? ?[place] ?[place] ?[address] ?[place], [place], [address] ?Phone: [phone]   Fax:  [phone] ? ?Name: Kaylee Guerrero ?MRN: [id number] ?Date of Birth: 01/22/1970 ? ? ? ?

## 2021-03-15 ENCOUNTER — Encounter: Payer: Self-pay | Admitting: Physical Therapy

## 2021-03-15 ENCOUNTER — Other Ambulatory Visit: Payer: Self-pay

## 2021-03-15 ENCOUNTER — Ambulatory Visit: Payer: BC Managed Care – PPO | Admitting: Physical Therapy

## 2021-03-15 DIAGNOSIS — M542 Cervicalgia: Secondary | ICD-10-CM

## 2021-03-15 DIAGNOSIS — M6281 Muscle weakness (generalized): Secondary | ICD-10-CM

## 2021-03-15 DIAGNOSIS — R293 Abnormal posture: Secondary | ICD-10-CM

## 2021-03-15 NOTE — Therapy (Signed)
[place] [facility] ?[address] ?[place], [place], [address] ?Phone: [phone]   Fax:  [phone] ? ?Physical Therapy Treatment ? ?Patient Details  ?Name: Kaylee Guerrero ?MRN: [id number] ?Date of Birth: 1970/08/02 ?Referring Provider (PT): Coletta Memos ? ? ?Encounter Date: 03/15/2021 ? ? PT End of Session - 03/15/21 0925   ? ? Visit Number 3   ? Date for PT Re-Evaluation 04/17/21   ? Authorization Type BCBS   ? Authorization Time Period 03/06/21 to 04/17/21   ? PT Start Time 0845   ? PT Stop Time 0930   ? PT Time Calculation (min) 45 min   ? Activity Tolerance Patient tolerated treatment well   ? Behavior During Therapy [facility] for tasks assessed/performed   ? ?  ?  ? ?  ? ? ?Past Medical History:  ?Diagnosis Date  ? Stroke [facility])   ? ? ?History reviewed. No pertinent surgical history. ? ?There were no vitals filed for this visit. ? ? Subjective Assessment - 03/15/21 0851   ? ? Subjective Hands started hurting more at about 10"30 yesterday morning. L hand is hurting bad today.   ? Currently in Pain? Yes   ? Pain Score 7    ? Pain Location Hand   ? Pain Orientation Left   ? ?  ?  ? ?  ? ? ? ? ? ? ? ? ? ? ? ? ? ? ? ? ? ? ? ? [facility] Adult PT Treatment/Exercise - 03/15/21 0001   ? ?  ? Neck Exercises: Machines for Strengthening  ? UBE (Upper Arm Bike) L1 x 2 min each   ?  ? Neck Exercises: Seated  ? Neck Retraction 10 reps;3 secs   yellow band  ? Other Seated Exercise ER red 2x10   ? Other Seated Exercise rows red 2x10   ?  ? Manual Therapy  ? Manual Therapy Neural Stretch   ? Soft tissue mobilization cervical para spinales, UT, scalenes   ? Passive ROM Cervical spine all directions with hend range holds   ? Manual Traction cervical spine 5 x30''   ? Neural Stretch LUE ulner and radial nerve glide   ? ?  ?  ? ?  ? ? ? ? ? ? ? ? ? ? ? ? PT Short Term Goals - 03/06/21 1116   ? ?  ? PT SHORT TERM GOAL #1  ? Title Will be compliant with appropriate HEP to be progressed PRN   ? Time  3   ? Period Weeks   ? Status New   ? Target Date 03/27/21   ?  ? PT SHORT TERM GOAL #2  ? Title Pain and numbness in LUE to have improved by 25% to show improvement in condition   ? Time 3   ? Period Weeks   ? Status New   ?  ? PT SHORT TERM GOAL #3  ? Title Will have good understanding of posture and desk ergonomics   ? Time 3   ? Period Weeks   ? Status New   ?  ? PT SHORT TERM GOAL #4  ? Title Will report 25% reduction in symptoms and when reaching overhead   ? Time 3   ? Period Weeks   ? Status New   ? ?  ?  ? ?  ? ? ? ? PT Long Term Goals - 03/06/21 1122   ? ?  ? PT LONG TERM  GOAL #1  ? Title MMT to improve by at least  1 grade in all weak groups to show improving functional strength   ? Time 6   ? Period Weeks   ? Status New   ? Target Date 04/17/21   ?  ? PT LONG TERM GOAL #2  ? Title Pain and symptoms in L UE to have improved by at least 50% to show improvement in condition   ? Time 6   ? Period Weeks   ? Status New   ?  ? PT LONG TERM GOAL #3  ? Title Will be able to lift at least 30-35# overhead without increase in pain   ? Time 6   ? Period Weeks   ? Status New   ?  ? PT LONG TERM GOAL #4  ? Title Pain to be no more than 2/10 in order to improve QOL, work task tolerance, and sleep patterns   ? Time 6   ? Period Weeks   ? Status New   ? ?  ?  ? ?  ? ? ? ? ? ? ? ? Plan - 03/15/21 0925   ? ? Clinical Impression Statement Pt enters clinic reporting increase L hand pain with numbness and tingling. LUE nerve glides did relieve some of her symptome but not completely. Heavy focus on MT during today session with some relief. No issues completing the exercises interventions.   ? Personal Factors and Comorbidities Fitness;Comorbidity 1;Profession;Time since onset of injury/illness/exacerbation   ? Examination-Activity Limitations Reach Overhead;Bed Mobility;Self Feeding;Caring for Others;Sleep;Dressing;Hygiene/Grooming;Toileting;Lift   ? Examination-Participation Restrictions Occupation;Cleaning;Community  Activity;Driving;Laundry;Pincus Badder Work   ? Stability/Clinical Decision Making Evolving/Moderate complexity   ? PT Frequency 2x / week   ? PT Duration 6 weeks   ? PT Treatment/Interventions ADLs/Self Care Home Management;Cryotherapy;Electrical Stimulation;Iontophoresis 4mg /ml Dexamethasone;Moist Heat;Traction;Ultrasound;Functional mobility training;Therapeutic activities;Therapeutic exercise;Balance training;Neuromuscular re-education;Patient/family education;Manual techniques;Scar mobilization;Passive range of motion;Dry needling;Energy conservation;Taping;Visual/perceptual remediation/compensation   ? PT Next Visit Plan postural training, strengthening as tolerated. Try first rib mobs and STM/DN to scalenes. Traction as tolerated (manual or machine)   ? ?  ?  ? ?  ? ? ?Patient will benefit from skilled therapeutic intervention in order to improve the following deficits and impairments:  Increased fascial restricitons, Increased muscle spasms, Impaired UE functional use, Decreased activity tolerance, Pain, Impaired flexibility, Improper body mechanics, Decreased strength, Increased edema, Impaired sensation, Postural dysfunction ? ?Visit Diagnosis: ?Cervicalgia ? ?Abnormal posture ? ?Muscle weakness (generalized) ? ? ? ? ?Problem List ?There are no problems to display for this patient. ? ? ? , PTA ?03/15/2021, 9:31 AM ? ?[place] [facility] ?[address] ?[place], [place], [place] ?Phone: [phone]   Fax:  [phone] ? ?Name: Kaylee Guerrero ?MRN: [id number] ?Date of Birth: [birth year] ? ? ? ?

## 2021-03-19 ENCOUNTER — Encounter: Payer: Self-pay | Admitting: Physical Therapy

## 2021-03-19 ENCOUNTER — Ambulatory Visit: Payer: BC Managed Care – PPO | Admitting: Physical Therapy

## 2021-03-19 ENCOUNTER — Other Ambulatory Visit: Payer: Self-pay

## 2021-03-19 DIAGNOSIS — M542 Cervicalgia: Secondary | ICD-10-CM

## 2021-03-19 DIAGNOSIS — M6281 Muscle weakness (generalized): Secondary | ICD-10-CM

## 2021-03-19 DIAGNOSIS — R293 Abnormal posture: Secondary | ICD-10-CM

## 2021-03-19 NOTE — Therapy (Signed)
[place] ?[address] ?[place], [place], [address] ?Phone: [phone]   Fax:  [phone] ? ?Physical Therapy Treatment ? ?Patient Details  ?Name: Kaylee Guerrero ?MRN: [id number] ?Date of Birth: March 07, 1970 ?Referring Provider (PT): Ashok Pall ? ? ?Encounter Date: 03/19/2021 ? ? PT End of Session - 03/19/21 1055   ? ? Visit Number 4   ? Number of Visits 13   ? Date for PT Re-Evaluation 04/17/21   ? PT Start Time 1015   ? PT Stop Time 1106   ? PT Time Calculation (min) 51 min   ? Activity Tolerance Patient tolerated treatment well   ? Behavior During Therapy [facility] for tasks assessed/performed   ? ?  ?  ? ?  ? ? ?Past Medical History:  ?Diagnosis Date  ? Stroke [facility])   ? ? ?History reviewed. No pertinent surgical history. ? ?There were no vitals filed for this visit. ? ? Subjective Assessment - 03/19/21 1015   ? ? Subjective "Tired" "Pretty good, arm is not as bad since last time" Some numbness in R finger tips.   ? Currently in Pain? No/denies   ? Pain Location Hand   ? Pain Orientation Left   ? Pain Descriptors / Indicators Numbness   ? ?  ?  ? ?  ? ? ? ? ? ? ? ? ? ? ? ? ? ? ? ? ? ? ? ? [place] Adult PT Treatment/Exercise - 03/19/21 0001   ? ?  ? Neck Exercises: Machines for Strengthening  ? UBE (Upper Arm Bike) L1 x 2 min each   ? Other Machines for Strengthening Ext 5lb 2x10, Rows 10lb 2x10   ? Other Machines for Strengthening Rows & Lats 20lb 2x10   ?  ? Neck Exercises: Standing  ? Other Standing Exercises Horiz Abd red 2x10   ? Other Standing Exercises shrugs w/ rev rolls 5lb 2x10   ?  ? Neck Exercises: Seated  ? Neck Retraction 10 reps;3 secs   ?  ? Modalities  ? Modalities Traction   ?  ? Traction  ? Type of Traction Cervical   ? Max (lbs) 13   ? Hold Time 10   ? Time 10   ?  ? Manual Therapy  ? Soft tissue mobilization cervical para spinales, UT, scalenes   ? Passive ROM Cervical spine all directions with hend range holds   ? ?  ?  ? ?   ? ? ? ? ? ? ? ? ? ? ? ? PT Short Term Goals - 03/19/21 1056   ? ?  ? PT SHORT TERM GOAL #1  ? Status Achieved   ?  ? PT SHORT TERM GOAL #2  ? Title Pain and numbness in LUE to have improved by 25% to show improvement in condition   ? Status Partially Met   ?  ? PT SHORT TERM GOAL #3  ? Title Will have good understanding of posture and desk ergonomics   ? Status Partially Met   ?  ? PT SHORT TERM GOAL #4  ? Title Will report 25% reduction in symptoms and when reaching overhead   ? Status Partially Met   ? ?  ?  ? ?  ? ? ? ? PT Long Term Goals - 03/06/21 1122   ? ?  ? PT LONG TERM GOAL #1  ? Title MMT to improve by at least  1 grade in all weak  groups to show improving functional strength   ? Time 6   ? Period Weeks   ? Status New   ? Target Date 04/17/21   ?  ? PT LONG TERM GOAL #2  ? Title Pain and symptoms in L UE to have improved by at least 50% to show improvement in condition   ? Time 6   ? Period Weeks   ? Status New   ?  ? PT LONG TERM GOAL #3  ? Title Will be able to lift at least 30-35# overhead without increase in pain   ? Time 6   ? Period Weeks   ? Status New   ?  ? PT LONG TERM GOAL #4  ? Title Pain to be no more than 2/10 in order to improve QOL, work task tolerance, and sleep patterns   ? Time 6   ? Period Weeks   ? Status New   ? ?  ?  ? ?  ? ? ? ? ? ? ? ? Plan - 03/19/21 1057   ? ? Clinical Impression Statement Pt enters clinic with improved symptoms compared to last session. Added more postural and strength interventions without issues. She did report a small increase in L hand numbness with UBE warm up. Tactile cues to prevent trunk flexion needed with shoulder extensions. Added mechanical cervical traction without issues.   ? Personal Factors and Comorbidities Fitness;Comorbidity 1;Profession;Time since onset of injury/illness/exacerbation   ? Examination-Activity Limitations Reach Overhead;Bed Mobility;Self Feeding;Caring for Others;Sleep;Dressing;Hygiene/Grooming;Toileting;Lift   ?  Examination-Participation Restrictions Occupation;Cleaning;Community Activity;Driving;Laundry;[employer] Work   ? Stability/Clinical Decision Making Evolving/Moderate complexity   ? Rehab Potential Good   ? PT Frequency 2x / week   ? PT Treatment/Interventions ADLs/Self Care Home Management;Cryotherapy;Electrical Stimulation;Iontophoresis 88m/ml Dexamethasone;Moist Heat;Traction;Ultrasound;Functional mobility training;Therapeutic activities;Therapeutic exercise;Balance training;Neuromuscular re-education;Patient/family education;Manual techniques;Scar mobilization;Passive range of motion;Dry needling;Energy conservation;Taping;Visual/perceptual remediation/compensation   ? PT Next Visit Plan postural training, strengthening as tolerated. assess  Traction   ? ?  ?  ? ?  ? ? ?Patient will benefit from skilled therapeutic intervention in order to improve the following deficits and impairments:  Increased fascial restricitons, Increased muscle spasms, Impaired UE functional use, Decreased activity tolerance, Pain, Impaired flexibility, Improper body mechanics, Decreased strength, Increased edema, Impaired sensation, Postural dysfunction ? ?Visit Diagnosis: ?Cervicalgia ? ?Abnormal posture ? ?Muscle weakness (generalized) ? ? ? ? ?Problem List ?There are no problems to display for this patient. ? ? ?RScot Jun PTA ?03/19/2021, 11:00 AM ? ? ?[place] ?[place] [address]?Phone: [phone]  Fax:  [phone]? ?Name: Kaylee Guerrero?MRN: [id number]?Date of Birth: [birth year]? ? ? ?

## 2021-03-21 ENCOUNTER — Encounter: Payer: Self-pay | Admitting: Physical Therapy

## 2021-03-21 ENCOUNTER — Ambulatory Visit: Payer: BC Managed Care – PPO | Admitting: Physical Therapy

## 2021-03-21 ENCOUNTER — Other Ambulatory Visit: Payer: Self-pay

## 2021-03-21 DIAGNOSIS — M6281 Muscle weakness (generalized): Secondary | ICD-10-CM

## 2021-03-21 DIAGNOSIS — R29898 Other symptoms and signs involving the musculoskeletal system: Secondary | ICD-10-CM

## 2021-03-21 DIAGNOSIS — M542 Cervicalgia: Secondary | ICD-10-CM | POA: Diagnosis not present

## 2021-03-21 DIAGNOSIS — R293 Abnormal posture: Secondary | ICD-10-CM

## 2021-03-21 NOTE — Therapy (Signed)
[place] ?[place] [address] ?[place], [place], [address] ?Phone: [phone]   Fax:  [phone] ? ?Physical Therapy Treatment ? ?Patient Details  ?Name: Kaylee Guerrero ?MRN: [id number] ?Date of Birth: November 11, 1970 ?Referring Provider (PT): Ashok Pall ? ? ?Encounter Date: 03/21/2021 ? ? PT End of Session - 03/21/21 1217   ? ? Visit Number 5   ? Number of Visits 13   ? Date for PT Re-Evaluation 04/17/21   ? Authorization Type BCBS   ? Authorization Time Period 03/06/21 to 04/17/21   ? PT Stop Time 1145   ? Activity Tolerance Patient tolerated treatment well   ? Behavior During Therapy [facility] for tasks assessed/performed   ? ?  ?  ? ?  ? ? ?Past Medical History:  ?Diagnosis Date  ? Stroke [facility])   ? ? ?History reviewed. No pertinent surgical history. ? ?There were no vitals filed for this visit. ? ? Subjective Assessment - 03/21/21 1149   ? ? Subjective Pretty good, just the numbness in the fingers and some soreness in the middle of the hand. No as painful as it has been.   ? Currently in Pain? No/denies   Numbness in L hand  ? ?  ?  ? ?  ? ? ? ? ? ? ? ? ? ? ? ? ? ? ? ? ? ? ? ? [place] Adult PT Treatment/Exercise - 03/21/21 0001   ? ?  ? Neck Exercises: Machines for Strengthening  ? UBE (Upper Arm Bike) L1.3 x 2.5 min each   ? Other Machines for Strengthening Ext 10lb 2x10, Rows 10lb 2x10   ? Other Machines for Strengthening Rows 35lb & Lats 25lb 2x10   ?  ? Neck Exercises: Standing  ? Other Standing Exercises Horiz Abd green 2x10   ? Other Standing Exercises Shoulder ER red 2x10   ?  ? Modalities  ? Modalities Traction   ?  ? Traction  ? Type of Traction Cervical   ? Max (lbs) 13   ? Hold Time 10   ? Time 10   ? ?  ?  ? ?  ? ? ? ? ? ? ? ? ? ? ? ? PT Short Term Goals - 03/19/21 1056   ? ?  ? PT SHORT TERM GOAL #1  ? Status Achieved   ?  ? PT SHORT TERM GOAL #2  ? Title Pain and numbness in LUE to have improved by 25% to show improvement in condition   ? Status Partially Met    ?  ? PT SHORT TERM GOAL #3  ? Title Will have good understanding of posture and desk ergonomics   ? Status Partially Met   ?  ? PT SHORT TERM GOAL #4  ? Title Will report 25% reduction in symptoms and when reaching overhead   ? Status Partially Met   ? ?  ?  ? ?  ? ? ? ? PT Long Term Goals - 03/21/21 1218   ? ?  ? PT LONG TERM GOAL #1  ? Title MMT to improve by at least  1 grade in all weak groups to show improving functional strength   ? Status On-going   ?  ? PT LONG TERM GOAL #2  ? Title Pain and symptoms in L UE to have improved by at least 50% to show improvement in condition   ? Status Partially Met   ?  ? PT LONG  TERM GOAL #3  ? Title Will be able to lift at least 30-35# overhead without increase in pain   ? Status On-going   ?  ? PT LONG TERM GOAL #4  ? Title Pain to be no more than 2/10 in order to improve QOL, work task tolerance, and sleep patterns   ? Status Partially Met   ? ?  ?  ? ?  ? ? ? ? ? ? ? ? Plan - 03/21/21 1219   ? ? Clinical Impression Statement Again pt enters with less pain overall but continues to have some numbness in the L finger tips. Increase resistance tolerated with seated rows and lats without issue. Cue to prevent shoulder elevation needed with standing shoulder extensions. Some UE burning a fatigue reported with external rotation and horizontal abduction. Continues with manual cervical traction.   ? Personal Factors and Comorbidities Fitness;Comorbidity 1;Profession;Time since onset of injury/illness/exacerbation   ? Examination-Activity Limitations Reach Overhead;Bed Mobility;Self Feeding;Caring for Others;Sleep;Dressing;Hygiene/Grooming;Toileting;Lift   ? Examination-Participation Restrictions Occupation;Cleaning;Community Activity;Driving;Laundry;[employer] Work   ? Rehab Potential Good   ? PT Frequency 2x / week   ? PT Duration 6 weeks   ? PT Treatment/Interventions ADLs/Self Care Home Management;Cryotherapy;Electrical Stimulation;Iontophoresis 31m/ml Dexamethasone;Moist  Heat;Traction;Ultrasound;Functional mobility training;Therapeutic activities;Therapeutic exercise;Balance training;Neuromuscular re-education;Patient/family education;Manual techniques;Scar mobilization;Passive range of motion;Dry needling;Energy conservation;Taping;Visual/perceptual remediation/compensation   ? PT Next Visit Plan postural training, strengthening as tolerated. assess  Traction   ? ?  ?  ? ?  ? ? ?Patient will benefit from skilled therapeutic intervention in order to improve the following deficits and impairments:  Increased fascial restricitons, Increased muscle spasms, Impaired UE functional use, Decreased activity tolerance, Pain, Impaired flexibility, Improper body mechanics, Decreased strength, Increased edema, Impaired sensation, Postural dysfunction ? ?Visit Diagnosis: ?Cervicalgia ? ?Abnormal posture ? ?Muscle weakness (generalized) ? ?Other symptoms and signs involving the musculoskeletal system ? ? ? ? ?Problem List ?There are no problems to display for this patient. ? ? ?RScot Jun PTA ?03/21/2021, 12:22 PM ? ?[place] ?[address] ?[place] [address]?Phone: [phone]  Fax:  [phone]? ?Name: Kaylee Guerrero?MRN: [id number]?Date of Birth: 612-08-1970? ? ? ?

## 2021-03-26 ENCOUNTER — Other Ambulatory Visit: Payer: Self-pay

## 2021-03-26 ENCOUNTER — Ambulatory Visit: Payer: BC Managed Care – PPO | Admitting: Physical Therapy

## 2021-03-26 ENCOUNTER — Encounter: Payer: Self-pay | Admitting: Physical Therapy

## 2021-03-26 DIAGNOSIS — M542 Cervicalgia: Secondary | ICD-10-CM | POA: Diagnosis not present

## 2021-03-26 DIAGNOSIS — R293 Abnormal posture: Secondary | ICD-10-CM

## 2021-03-26 DIAGNOSIS — M6281 Muscle weakness (generalized): Secondary | ICD-10-CM

## 2021-03-26 NOTE — Therapy (Signed)
[place] ?[place] [address] ?[place], [place], [address] ?Phone: [phone]   Fax:  [phone] ? ?Physical Therapy Treatment ? ?Patient Details  ?Name: Kaylee Guerrero ?MRN: [id number] ?Date of Birth: 10-01-1970 ?Referring Provider (PT): Ashok Pall ? ? ?Encounter Date: 03/26/2021 ? ? PT End of Session - 03/26/21 1050   ? ? Visit Number 6   ? Date for PT Re-Evaluation 04/17/21   ? Authorization Time Period 03/06/21 to 04/17/21   ? PT Start Time 1015   ? PT Stop Time 1100   ? PT Time Calculation (min) 45 min   ? Activity Tolerance Patient tolerated treatment well   ? Behavior During Therapy [facility] for tasks assessed/performed   ? ?  ?  ? ?  ? ? ?Past Medical History:  ?Diagnosis Date  ? Stroke [facility])   ? ? ?History reviewed. No pertinent surgical history. ? ?There were no vitals filed for this visit. ? ? Subjective Assessment - 03/26/21 1018   ? ? Subjective Other than tired  she feel pretty good. Some numbness in the fingers.   ? Currently in Pain? No/denies   ? ?  ?  ? ?  ? ? ? ? ? ? ? ? ? ? ? ? ? ? ? ? ? ? ? ? [place] Adult PT Treatment/Exercise - 03/26/21 0001   ? ?  ? Neck Exercises: Machines for Strengthening  ? UBE (Upper Arm Bike) L1.5 x 3 min each   ? Other Machines for Strengthening Ext 10lb 2x10, Rows 15lb 2x10   ?  ? Neck Exercises: Standing  ? Other Standing Exercises Horiz Abd green 2x10   ? Other Standing Exercises Shoulder ER green 2x10   ?  ? Neck Exercises: Supine  ? Neck Retraction 10 reps;3 secs   x2  ? Other Supine Exercise birdges x10   ? Other Supine Exercise LE on [place] bridges, K2C, Oblq   ?  ? Modalities  ? Modalities Traction   ?  ? Traction  ? Type of Traction Cervical   ? Max (lbs) 14   ? Hold Time 10   ? Time 10   ? ?  ?  ? ?  ? ? ? ? ? ? ? ? ? ? ? ? PT Short Term Goals - 03/19/21 1056   ? ?  ? PT SHORT TERM GOAL #1  ? Status Achieved   ?  ? PT SHORT TERM GOAL #2  ? Title Pain and numbness in LUE to have improved by 25% to show improvement in  condition   ? Status Partially Met   ?  ? PT SHORT TERM GOAL #3  ? Title Will have good understanding of posture and desk ergonomics   ? Status Partially Met   ?  ? PT SHORT TERM GOAL #4  ? Title Will report 25% reduction in symptoms and when reaching overhead   ? Status Partially Met   ? ?  ?  ? ?  ? ? ? ? PT Long Term Goals - 03/26/21 1051   ? ?  ? PT LONG TERM GOAL #1  ? Title MMT to improve by at least  1 grade in all weak groups to show improving functional strength   ? Status On-going   ?  ? PT LONG TERM GOAL #2  ? Title Pain and symptoms in L UE to have improved by at least 50% to show improvement in condition   ?  Status Partially Met   ?  ? PT LONG TERM GOAL #3  ? Title Will be able to lift at least 30-35# overhead without increase in pain   ? Status On-going   ?  ? PT LONG TERM GOAL #4  ? Title Pain to be no more than 2/10 in order to improve QOL, work task tolerance, and sleep patterns   ? Status Partially Met   ? ?  ?  ? ?  ? ? ? ? ? ? ? ? Plan - 03/26/21 1051   ? ? Clinical Impression Statement Pt reports an overall improvement despite some numbness in the ends of her digit three and four at the distal ends. Added lumbar strengthening to today's interventions without issue. Pt did well completing all interventions. Some shoulder burning reported with shoulder external rotation.   ? Personal Factors and Comorbidities Fitness;Comorbidity 1;Profession;Time since onset of injury/illness/exacerbation   ? Examination-Activity Limitations Reach Overhead;Bed Mobility;Self Feeding;Caring for Others;Sleep;Dressing;Hygiene/Grooming;Toileting;Lift   ? Examination-Participation Restrictions Occupation;Cleaning;Community Activity;Driving;Laundry;[employer] Work   ? Rehab Potential Good   ? PT Frequency 2x / week   ? PT Duration 6 weeks   ? PT Treatment/Interventions ADLs/Self Care Home Management;Cryotherapy;Electrical Stimulation;Iontophoresis 63m/ml Dexamethasone;Moist Heat;Traction;Ultrasound;Functional mobility  training;Therapeutic activities;Therapeutic exercise;Balance training;Neuromuscular re-education;Patient/family education;Manual techniques;Scar mobilization;Passive range of motion;Dry needling;Energy conservation;Taping;Visual/perceptual remediation/compensation   ? PT Next Visit Plan postural training, strengthening as tolerated. assess  Traction   ? ?  ?  ? ?  ? ? ?Patient will benefit from skilled therapeutic intervention in order to improve the following deficits and impairments:  Increased fascial restricitons, Increased muscle spasms, Impaired UE functional use, Decreased activity tolerance, Pain, Impaired flexibility, Improper body mechanics, Decreased strength, Increased edema, Impaired sensation, Postural dysfunction ? ?Visit Diagnosis: ?Cervicalgia ? ?Muscle weakness (generalized) ? ?Abnormal posture ? ? ? ? ?Problem List ?There are no problems to display for this patient. ? ? ?RScot Jun PTA ?03/26/2021, 10:56 AM ? ?[place] ?[place] ?[place] [address]?Phone: [phone]  Fax:  [phone]? ?Name: Kaylee Guerrero?MRN: [id number]?Date of Birth: [birth year]? ? ? ?

## 2021-03-28 ENCOUNTER — Other Ambulatory Visit: Payer: Self-pay

## 2021-03-28 ENCOUNTER — Ambulatory Visit: Payer: BC Managed Care – PPO | Admitting: Physical Therapy

## 2021-03-28 DIAGNOSIS — M542 Cervicalgia: Secondary | ICD-10-CM | POA: Diagnosis not present

## 2021-03-28 DIAGNOSIS — M6281 Muscle weakness (generalized): Secondary | ICD-10-CM

## 2021-03-28 DIAGNOSIS — R293 Abnormal posture: Secondary | ICD-10-CM

## 2021-03-28 NOTE — Therapy (Signed)
[place] ?[facility] ?[address] ?[place], [place], [address] ?Phone: [phone]   Fax:  [phone] ? ?Physical Therapy Treatment ? ?Patient Details  ?Name: Kaylee Guerrero ?MRN: [id number] ?Date of Birth: 10/11/1970 ?Referring Provider (PT): Kyle Cabbell ? ? ?Encounter Date: 03/28/2021 ? ? PT End of Session - 03/28/21 1043   ? ? Visit Number 7   ? Number of Visits 13   ? Date for PT Re-Evaluation 04/17/21   ? Authorization Type BCBS   ? Authorization Time Period 03/06/21 to 04/17/21   ? PT Start Time 1015   ? PT Stop Time 1100   ? PT Time Calculation (min) 45 min   ? ?  ?  ? ?  ? ? ?Past Medical History:  ?Diagnosis Date  ? Stroke (HCC)   ? ? ?No past surgical history on file. ? ?There were no vitals filed for this visit. ? ? Subjective Assessment - 03/28/21 1019   ? ? Subjective no significant changes NT digits 3-5 on Left UE, worse with gripping. 1 x in last couple days it went completely away for 5 min. maybe traction is helping?   ? Currently in Pain? No/denies   ? ?  ?  ? ?  ? ? ? ? ? ? ? ? ? ? ? ? ? ? ? ? ? ? ? ? [facility] Adult PT Treatment/Exercise - 03/28/21 0001   ? ?  ? Neck Exercises: Machines for Strengthening  ? UBE (Upper Arm Bike) L2x 3 min each   ? Other Machines for Strengthening Ext 10lb 2x10, Rows 15lb 2x10   ?  ? Neck Exercises: Theraband  ? Other Theraband Exercises 3 pt stab on wall 10 x each   ?  ? Neck Exercises: Standing  ? Neck Retraction 10 reps   head on ball with multi UE stab ex with 3# free wts  ? Other Standing Exercises Horiz Abd green 2x10   ball vs wall 5 x CW and CCW  ? Other Standing Exercises Shoulder ER green 2x10   ?  ? Traction  ? Type of Traction Cervical   ? Max (lbs) 14   ? Hold Time 10   ? Time 10   ? ?  ?  ? ?  ? ? ? ? ? ? ? ? ? ? ? ? PT Short Term Goals - 03/19/21 1056   ? ?  ? PT SHORT TERM GOAL #1  ? Status Achieved   ?  ? PT SHORT TERM GOAL #2  ? Title Pain and numbness in LUE to have improved by 25% to show improvement in  condition   ? Status Partially Met   ?  ? PT SHORT TERM GOAL #3  ? Title Will have good understanding of posture and desk ergonomics   ? Status Partially Met   ?  ? PT SHORT TERM GOAL #4  ? Title Will report 25% reduction in symptoms and when reaching overhead   ? Status Partially Met   ? ?  ?  ? ?  ? ? ? ? PT Long Term Goals - 03/26/21 1051   ? ?  ? PT LONG TERM GOAL #1  ? Title MMT to improve by at least  1 grade in all weak groups to show improving functional strength   ? Status On-going   ?  ? PT LONG TERM GOAL #2  ? Title Pain and symptoms in L UE to have   improved by at least 50% to show improvement in condition   ? Status Partially Met   ?  ? PT LONG TERM GOAL #3  ? Title Will be able to lift at least 30-35# overhead without increase in pain   ? Status On-going   ?  ? PT LONG TERM GOAL #4  ? Title Pain to be no more than 2/10 in order to improve QOL, work task tolerance, and sleep patterns   ? Status Partially Met   ? ?  ?  ? ?  ? ? ? ? ? ? ? ? Plan - 03/28/21 1043   ? ? Clinical Impression Statement progressed postural stab ex with fatigue but no pain or changes in NT.no significant changes NT digits 3-5 on Left UE, worse with gripping. 1 x in last couple days it went completely away for 5 min. maybe traction is helping?   ? ?  ?  ? ?  ? ? ?Patient will benefit from skilled therapeutic intervention in order to improve the following deficits and impairments:    ? ?Visit Diagnosis: ?Cervicalgia ? ?Muscle weakness (generalized) ? ?Abnormal posture ? ? ? ? ?Problem List ?There are no problems to display for this patient. ? ? ?Jaziah Goeller,ANGIE, PTA ?03/28/2021, 10:45 AM ? ?[place] ?[address] ?[place], [place], [address] ?Phone: [phone]   Fax:  [phone] ? ?Name: Kaylee Guerrero ?MRN: [id number] ?Date of Birth: 12/10/1970 ? ? ? ?

## 2021-04-02 ENCOUNTER — Encounter: Payer: Self-pay | Admitting: Physical Therapy

## 2021-04-02 ENCOUNTER — Other Ambulatory Visit: Payer: Self-pay

## 2021-04-02 ENCOUNTER — Ambulatory Visit: Payer: BC Managed Care – PPO | Admitting: Physical Therapy

## 2021-04-02 DIAGNOSIS — M542 Cervicalgia: Secondary | ICD-10-CM | POA: Diagnosis not present

## 2021-04-02 DIAGNOSIS — M6281 Muscle weakness (generalized): Secondary | ICD-10-CM

## 2021-04-02 DIAGNOSIS — R29898 Other symptoms and signs involving the musculoskeletal system: Secondary | ICD-10-CM

## 2021-04-02 DIAGNOSIS — R293 Abnormal posture: Secondary | ICD-10-CM

## 2021-04-02 NOTE — Therapy (Signed)
[place] ?[place] ?[address] ?[place], [place], [address] ?Phone: [phone]   Fax:  [phone] ? ?Physical Therapy Treatment ? ?Patient Details  ?Name: Kaylee Guerrero ?MRN: [id number] ?Date of Birth: 04/05/1970 ?Referring Provider (PT): Kaylee Guerrero ? ? ?Encounter Date: 04/02/2021 ? ? PT End of Session - 04/02/21 1102   ? ? Visit Number 8   ? Number of Visits 13   ? Date for PT Re-Evaluation 04/17/21   ? Authorization Type BCBS   ? Authorization Time Period 03/06/21 to 04/17/21   ? Authorization - Number of Visits 20   ? PT Start Time 1018   ? PT Stop Time 1056   ? PT Time Calculation (min) 38 min   ? Activity Tolerance Patient tolerated treatment well   ? Behavior During Therapy [facility] for tasks assessed/performed   ? ?  ?  ? ?  ? ? ?Past Medical History:  ?Diagnosis Date  ? Stroke [facility])   ? ? ?History reviewed. No pertinent surgical history. ? ?There were no vitals filed for this visit. ? ? Subjective Assessment - 04/02/21 1024   ? ? Subjective I have been having some issues with ongoing numbness in my hand when I grip things; feeling better but last Friday my symptoms got worse than better again after doing some yard work but hte pain was going up not down   ? Patient Stated Goals be in less pain, get rid of tingling   ? Currently in Pain? No/denies   just numbness in my fingers  ? ?  ?  ? ?  ? ? ? ? ? ? ? ? ? ? ? ? ? ? ? ? ? ? ? ? [place] Adult PT Treatment/Exercise - 04/02/21 0001   ? ?  ? Neck Exercises: Seated  ? Neck Retraction 10 reps;3 secs   ? Cervical Rotation Both;10 reps   ? Cervical Rotation Limitations with chin tuck   ? Lateral Flexion Both;10 reps   ? Lateral Flexion Limitations with chin tuck   ?  ? Neck Exercises: Stretches  ? Upper Trapezius Stretch 3 reps;60 seconds   ? Upper Trapezius Stretch Limitations in supine with OP to L GH joint for increased stretch   ? Other Neck Stretches attempted trigger point release in supine but this increased  radicular sx in hand   ?  ? Manual Therapy  ? Manual Therapy Soft tissue mobilization;Myofascial release;Joint mobilization   ? Joint Mobilization first rib mobs L   ? Soft tissue mobilization L UT and scalenes, suboccipitals   ? Myofascial Release L UT   ? Manual Traction cervical spine 4x30 seconds   ? ?  ?  ? ?  ? ? ? ? ? ? ? ? ? ? PT Education - 04/02/21 1102   ? ? Education Details role of DN and how it can help manage symptoms and trigger points possibly radicular issues too   ? Person(s) Educated Patient   ? Methods Explanation   ? Comprehension Verbalized understanding   ? ?  ?  ? ?  ? ? ? PT Short Term Goals - 03/19/21 1056   ? ?  ? PT SHORT TERM GOAL #1  ? Status Achieved   ?  ? PT SHORT TERM GOAL #2  ? Title Pain and numbness in LUE to have improved by 25% to show improvement in condition   ? Status Partially Met   ?  ? PT SHORT  TERM GOAL #3  ? Title Will have good understanding of posture and desk ergonomics   ? Status Partially Met   ?  ? PT SHORT TERM GOAL #4  ? Title Will report 25% reduction in symptoms and when reaching overhead   ? Status Partially Met   ? ?  ?  ? ?  ? ? ? ? PT Long Term Goals - 03/26/21 1051   ? ?  ? PT LONG TERM GOAL #1  ? Title MMT to improve by at least  1 grade in all weak groups to show improving functional strength   ? Status On-going   ?  ? PT LONG TERM GOAL #2  ? Title Pain and symptoms in L UE to have improved by at least 50% to show improvement in condition   ? Status Partially Met   ?  ? PT LONG TERM GOAL #3  ? Title Will be able to lift at least 30-35# overhead without increase in pain   ? Status On-going   ?  ? PT LONG TERM GOAL #4  ? Title Pain to be no more than 2/10 in order to improve QOL, work task tolerance, and sleep patterns   ? Status Partially Met   ? ?  ?  ? ?  ? ? ? ? ? ? ? ? Plan - 04/02/21 1102   ? ? Clinical Impression Statement Kaylee Guerrero arrives doing better, still has a lot of soft tissue limitations in L upper quadrant and I spent a lot of time on  manual interventions today, tolerated well. I still really think she will benefit from DN to L scalenes and upper trap to help relieve sx, she will think about it and we will potentially address it next time. Seems to be making progress overall.   ? Personal Factors and Comorbidities Fitness;Comorbidity 1;Profession;Time since onset of injury/illness/exacerbation   ? Comorbidities h/o cva   ? Examination-Activity Limitations Reach Overhead;Bed Mobility;Self Feeding;Caring for Others;Sleep;Dressing;Hygiene/Grooming;Toileting;Lift   ? Examination-Participation Restrictions Occupation;Cleaning;Community Activity;Driving;Laundry;[employer] Work   ? Stability/Clinical Decision Making Evolving/Moderate complexity   ? Clinical Decision Making Moderate   ? Rehab Potential Good   ? PT Frequency 2x / week   ? PT Duration 6 weeks   ? PT Treatment/Interventions ADLs/Self Care Home Management;Cryotherapy;Electrical Stimulation;Iontophoresis 13m/ml Dexamethasone;Moist Heat;Traction;Ultrasound;Functional mobility training;Therapeutic activities;Therapeutic exercise;Balance training;Neuromuscular re-education;Patient/family education;Manual techniques;Scar mobilization;Passive range of motion;Dry needling;Energy conservation;Taping;Visual/perceptual remediation/compensation   ? PT Next Visit Plan needs dry needling to upper traps and scalenes   ? PT Home Exercise Plan [email or web address]  ? Consulted and Agree with Plan of Care Patient   ? ?  ?  ? ?  ? ? ?Patient will benefit from skilled therapeutic intervention in order to improve the following deficits and impairments:  Increased fascial restricitons, Increased muscle spasms, Impaired UE functional use, Decreased activity tolerance, Pain, Impaired flexibility, Improper body mechanics, Decreased strength, Increased edema, Impaired sensation, Postural dysfunction ? ?Visit Diagnosis: ?Cervicalgia ? ?Muscle weakness (generalized) ? ?Abnormal posture ? ?Other symptoms and signs involving the  musculoskeletal system ? ? ? ? ?Problem List ?There are no problems to display for this patient. ? ?Mikiah Demond U PT, DPT, PN2  ? ?Supplemental Physical Therapist ?[place] ? ? ? ? ? ?[place] ?[address] ?[place] [address]?Phone: [phone]  Fax:  [phone]? ?Name: Kaylee Guerrero?MRN: [id number]?Date of Birth: 612/25/1972? ? ? ?

## 2021-04-04 ENCOUNTER — Ambulatory Visit: Payer: BC Managed Care – PPO | Admitting: Physical Therapy

## 2021-04-04 ENCOUNTER — Encounter: Payer: Self-pay | Admitting: Physical Therapy

## 2021-04-04 DIAGNOSIS — R293 Abnormal posture: Secondary | ICD-10-CM

## 2021-04-04 DIAGNOSIS — R29898 Other symptoms and signs involving the musculoskeletal system: Secondary | ICD-10-CM

## 2021-04-04 DIAGNOSIS — M542 Cervicalgia: Secondary | ICD-10-CM

## 2021-04-04 DIAGNOSIS — M6281 Muscle weakness (generalized): Secondary | ICD-10-CM

## 2021-04-04 NOTE — Patient Instructions (Signed)

## 2021-04-04 NOTE — Therapy (Signed)
[place] ?[place] ?[address] ?[place], [place], [address] ?Phone: [phone]   Fax:  [phone] ? ?Physical Therapy Treatment ? ?Patient Details  ?Name: Kaylee Guerrero ?MRN: [id number] ?Date of Birth: 06-12-1970 ?Referring Provider (PT): Ashok Pall ? ? ?Encounter Date: 04/04/2021 ? ? PT End of Session - 04/04/21 1159   ? ? Visit Number 9   ? Number of Visits 13   ? Date for PT Re-Evaluation 04/17/21   ? Authorization Type BCBS   ? Authorization Time Period 03/06/21 to 04/17/21   ? Authorization - Number of Visits 20   ? PT Start Time 1016   ? PT Stop Time 1056   ? PT Time Calculation (min) 40 min   ? Activity Tolerance Patient tolerated treatment well   ? Behavior During Therapy [facility] for tasks assessed/performed   ? ?  ?  ? ?  ? ? ?Past Medical History:  ?Diagnosis Date  ? Stroke [facility])   ? ? ?History reviewed. No pertinent surgical history. ? ?There were no vitals filed for this visit. ? ? Subjective Assessment - 04/04/21 1019   ? ? Subjective I feel OK this morning, felt good after last session and this morning but had another bout of numbness in the middle of the night. I can't get into a position that resolves it I just have to get up and move around   ? Patient Stated Goals be in less pain, get rid of tingling   ? Currently in Pain? No/denies   just tingling in L hand  ? ?  ?  ? ?  ? ? ? ? ? ? ? ? ? ? ? ? ? ? ? ? ? ? ? ? [place] Adult PT Treatment/Exercise - 04/04/21 0001   ? ?  ? Neck Exercises: Machines for Strengthening  ? UBE (Upper Arm Bike) L2.5 x3 min foward and backward   ? Other Machines for Strengthening shoulder extensions 10# 1x10 then 15# 1x7, lawnmower rows 7# 1x10   ? Other Machines for Strengthening Rows 35lb 2x10 & Lats 25lb 2x10   ?  ? Neck Exercises: Stretches  ? Upper Trapezius Stretch --   ? Upper Trapezius Stretch Limitations --   ?  ? Manual Therapy  ? Manual Therapy Muscle Energy Technique   ? Soft tissue mobilization LUT and scalenes after  DN   ? Muscle Energy Technique UT MET x4 rounds B   ? ?  ?  ? ?  ? ? ? Trigger Point Dry Needling - 04/04/21 0001   ? ? Consent Given? Yes   ? Education Handout Provided Yes   ? Muscles Treated Head and Neck Upper trapezius   ? Upper Trapezius Response Twitch reponse elicited;Palpable increased muscle length   ? ?  ?  ? ?  ? ? ? ? ? ? ? ? PT Education - 04/04/21 1159   ? ? Education Details DN precautions/what to expect after and role of STM in reducing soreness   ? Person(s) Educated Patient   ? Methods Explanation   ? Comprehension Verbalized understanding   ? ?  ?  ? ?  ? ? ? PT Short Term Goals - 03/19/21 1056   ? ?  ? PT SHORT TERM GOAL #1  ? Status Achieved   ?  ? PT SHORT TERM GOAL #2  ? Title Pain and numbness in LUE to have improved by 25% to show improvement in condition   ?  Status Partially Met   ?  ? PT SHORT TERM GOAL #3  ? Title Will have good understanding of posture and desk ergonomics   ? Status Partially Met   ?  ? PT SHORT TERM GOAL #4  ? Title Will report 25% reduction in symptoms and when reaching overhead   ? Status Partially Met   ? ?  ?  ? ?  ? ? ? ? PT Long Term Goals - 03/26/21 1051   ? ?  ? PT LONG TERM GOAL #1  ? Title MMT to improve by at least  1 grade in all weak groups to show improving functional strength   ? Status On-going   ?  ? PT LONG TERM GOAL #2  ? Title Pain and symptoms in L UE to have improved by at least 50% to show improvement in condition   ? Status Partially Met   ?  ? PT LONG TERM GOAL #3  ? Title Will be able to lift at least 30-35# overhead without increase in pain   ? Status On-going   ?  ? PT LONG TERM GOAL #4  ? Title Pain to be no more than 2/10 in order to improve QOL, work task tolerance, and sleep patterns   ? Status Partially Met   ? ?  ?  ? ?  ? ? ? ? ? ? ? ? Plan - 04/04/21 1200   ? ? Clinical Impression Statement Kaylee Guerrero arrives feeling OK this morning, still having increased numbness in her L UE with tasks that elevate her arm (IE holding onto top of  steering wheel, using UBE here, etc) which I do still believe is more consistent with thoracic outlet syndrome especially since ROOS test was dramatically positive at eval. Worked on some upper trap MET, followed by DN and then STM techniques to reduce soreness. Otherwise progressed postural training and strengthening as able. Will continue efforts.   ? Personal Factors and Comorbidities Fitness;Comorbidity 1;Profession;Time since onset of injury/illness/exacerbation   ? Comorbidities h/o cva   ? Examination-Activity Limitations Reach Overhead;Bed Mobility;Self Feeding;Caring for Others;Sleep;Dressing;Hygiene/Grooming;Toileting;Lift   ? Examination-Participation Restrictions Occupation;Cleaning;Community Activity;Driving;Laundry;[employer] Work   ? Stability/Clinical Decision Making Evolving/Moderate complexity   ? Clinical Decision Making Moderate   ? Rehab Potential Good   ? PT Frequency 2x / week   ? PT Duration 6 weeks   ? PT Treatment/Interventions ADLs/Self Care Home Management;Cryotherapy;Electrical Stimulation;Iontophoresis 66m/ml Dexamethasone;Moist Heat;Traction;Ultrasound;Functional mobility training;Therapeutic activities;Therapeutic exercise;Balance training;Neuromuscular re-education;Patient/family education;Manual techniques;Scar mobilization;Passive range of motion;Dry needling;Energy conservation;Taping;Visual/perceptual remediation/compensation   ? PT Next Visit Plan what did she think of DN? Needs more if she is agreeable   ? PT Home Exercise Plan [email or web address]  ? Consulted and Agree with Plan of Care Patient   ? ?  ?  ? ?  ? ? ?Patient will benefit from skilled therapeutic intervention in order to improve the following deficits and impairments:  Increased fascial restricitons, Increased muscle spasms, Impaired UE functional use, Decreased activity tolerance, Pain, Impaired flexibility, Improper body mechanics, Decreased strength, Increased edema, Impaired sensation, Postural dysfunction ? ?Visit  Diagnosis: ?Cervicalgia ? ?Muscle weakness (generalized) ? ?Abnormal posture ? ?Other symptoms and signs involving the musculoskeletal system ? ? ? ? ?Problem List ?There are no problems to display for this patient. ? ?Clee Pandit U PT, DPT, PN2  ? ?Supplemental Physical Therapist ?[place] ? ? ? ? ? ?[place] ?[address] [place] ?[place] [address]?Phone: [phone]  Fax:  [phone] ? ?Name: Kaylee Guerrero ?MRN: [id number] ?Date of Birth: Jan 12, 1970 ? ? ? ?

## 2021-04-12 ENCOUNTER — Encounter: Payer: Self-pay | Admitting: Physical Therapy

## 2021-04-12 ENCOUNTER — Ambulatory Visit: Payer: BC Managed Care – PPO | Attending: Neurosurgery | Admitting: Physical Therapy

## 2021-04-12 DIAGNOSIS — M542 Cervicalgia: Secondary | ICD-10-CM | POA: Diagnosis present

## 2021-04-12 DIAGNOSIS — R293 Abnormal posture: Secondary | ICD-10-CM | POA: Diagnosis present

## 2021-04-12 DIAGNOSIS — M6281 Muscle weakness (generalized): Secondary | ICD-10-CM | POA: Diagnosis present

## 2021-04-12 DIAGNOSIS — R29898 Other symptoms and signs involving the musculoskeletal system: Secondary | ICD-10-CM | POA: Diagnosis present

## 2021-04-12 NOTE — Therapy (Signed)
[place] ?[facility] ?[address] ?[place], [place], [address] ?Phone: [phone]   Fax:  [phone] ? ?Physical Therapy Treatment ?Progress Note ?Reporting Period 03/06/21 to 04/12/21 ? ?See note below for Objective Data and Assessment of Progress/Goals.  ? ?  ?Patient Details  ?Name: Kaylee Guerrero ?MRN: [id number] ?Date of Birth: 1970/01/29 ?Referring Provider (PT): Coletta Memos ? ? ?Encounter Date: 04/12/2021 ? ? PT End of Session - 04/12/21 0925   ? ? Visit Number 10   ? Date for PT Re-Evaluation 04/17/21   ? PT Start Time [phone]   ? PT Stop Time 0926   ? PT Time Calculation (min) 45 min   ? Activity Tolerance Patient tolerated treatment well   ? Behavior During Therapy [facility] for tasks assessed/performed   ? ?  ?  ? ?  ? ? ?Past Medical History:  ?Diagnosis Date  ? Stroke [facility])   ? ? ?History reviewed. No pertinent surgical history. ? ?There were no vitals filed for this visit. ? ? Subjective Assessment - 04/12/21 0842   ? ? Subjective Patient reports she feels good. She has less numbness in her L fingers. She feels the DN was effective. He hand is still going numb during the night, but slightly improved here also.   ? Currently in Pain? No/denies   ? ?  ?  ? ?  ? ? ? ? ? ? ? ? ? ? ? ? ? ? ? ? ? ? ? ? [facility] Adult PT Treatment/Exercise - 04/12/21 0001   ? ?  ? Neck Exercises: Machines for Strengthening  ? Lat Pull 20#, 2 x 10 reps   ?  ? Neck Exercises: Standing  ? Wall Wash Back to wall with chin tuck, angel wings x 5.   ? Other Standing Exercises Rows and shoulder ext 2 x 10 reps, 15# at power tower   ?  ? Neck Exercises: Seated  ? Other Seated Exercise ER Green 2x10   ?  ? Manual Therapy  ? Manual Therapy Soft tissue mobilization;Passive ROM;Manual Traction   ? Joint Mobilization first rib mobs L   ? Soft tissue mobilization LUT and scalenes after DN   ? Manual Traction cervical spine 60 sec   ? ?  ?  ? ?  ? ? ? Trigger Point Dry Needling - 04/12/21 0001   ? ? Consent  Given? Yes   ? Education Handout Provided Previously provided   ? Muscles Treated Head and Neck Upper trapezius;Cervical multifidi;Levator scapulae   ? Dry Needling Comments good overall tolerance, twitch responses elicitied. DN performed by Ivery Quale PT,DPT   ? ?  ?  ? ?  ? ? ? ? ? ? ? ? ? ? PT Short Term Goals - 04/12/21 0848   ? ?  ? PT SHORT TERM GOAL #1  ? Status Achieved   ?  ? PT SHORT TERM GOAL #2  ? Title Pain and numbness in LUE to have improved by 25% to show improvement in condition   ? Baseline Patient reports less numbness in L fingers.   ? Status Achieved   ?  ? PT SHORT TERM GOAL #3  ? Title Will have good understanding of posture and desk ergonomics   ? Status Achieved   ?  ? PT SHORT TERM GOAL #4  ? Title Will report 25% reduction in symptoms and when reaching overhead   ? Status Achieved   ? ?  ?  ? ?  ? ? ? ?  PT Long Term Goals - 04/12/21 0915   ? ?  ? PT LONG TERM GOAL #1  ? Title MMT to improve by at least  1 grade in all weak groups to show improving functional strength   ? Time 2   ? Period Weeks   ? Status On-going   ?  ? PT LONG TERM GOAL #2  ? Title Pain and symptoms in L UE to have improved by at least 50% to show improvement in condition   ? Time 2   ? Period Weeks   ? Status On-going   ?  ? PT LONG TERM GOAL #3  ? Title Will be able to lift at least 30-35# overhead without increase in pain   ? Time 2   ? Period Weeks   ? Status On-going   ?  ? PT LONG TERM GOAL #4  ? Title Pain to be no more than 2/10 in order to improve QOL, work task tolerance, and sleep patterns   ? Baseline Pain and numbness still interfering in sleep.   ? Time 2   ? Status On-going   ? ?  ?  ? ?  ? ? ? ? ? ? ? ? Plan - 04/12/21 0849   ? ? Clinical Impression Statement Patient reports improvement in her symptoms of numbness in her L fingers. She felt the DN was effective and participated in another round of DN today F/B stretch and strengthening to upper trunk. Functional status update for PN.   ? Personal  Factors and Comorbidities Fitness;Comorbidity 1;Profession;Time since onset of injury/illness/exacerbation   ? Comorbidities h/o cva   ? Examination-Activity Limitations Reach Overhead;Bed Mobility;Self Feeding;Caring for Others;Sleep;Dressing;Hygiene/Grooming;Toileting;Lift   ? Examination-Participation Restrictions Occupation;Cleaning;Community Activity;Driving;Laundry;Pincus Badder Work   ? Stability/Clinical Decision Making Evolving/Moderate complexity   ? Clinical Decision Making Moderate   ? Rehab Potential Good   ? PT Frequency 2x / week   ? PT Duration Other (comment)   5w  ? PT Treatment/Interventions ADLs/Self Care Home Management;Cryotherapy;Electrical Stimulation;Iontophoresis 4mg /ml Dexamethasone;Moist Heat;Traction;Ultrasound;Functional mobility training;Therapeutic activities;Therapeutic exercise;Balance training;Neuromuscular re-education;Patient/family education;Manual techniques;Scar mobilization;Passive range of motion;Dry needling;Energy conservation;Taping;Visual/perceptual remediation/compensation   ? PT Next Visit Plan Further DN? Update HEP for strength and postural stability.   ? PT Home Exercise Plan   ? Consulted and Agree with Plan of Care Patient   ? ?  ?  ? ?  ? ? ?Patient will benefit from skilled therapeutic intervention in order to improve the following deficits and impairments:  Increased fascial restricitons, Increased muscle spasms, Impaired UE functional use, Decreased activity tolerance, Pain, Impaired flexibility, Improper body mechanics, Decreased strength, Increased edema, Impaired sensation, Postural dysfunction ? ?Visit Diagnosis: ?Cervicalgia ? ?Muscle weakness (generalized) ? ?Abnormal posture ? ?Other symptoms and signs involving the musculoskeletal system ? ? ? ? ?Problem List ?There are no problems to display for this patient. ? ? ?[id number], DPT ?04/12/2021, 9:36 AM ? ?[place] ?[facility] ?[address] [facility] ?[place],  [place], [place] ?Phone: [phone]   Fax:  [phone] ? ?Name: Kaylee Guerrero ?MRN: [id number] ?Date of Birth: 13-Oct-1970 ? ? ? ?

## 2021-04-16 ENCOUNTER — Encounter: Payer: Self-pay | Admitting: Physical Therapy

## 2021-04-16 ENCOUNTER — Ambulatory Visit: Payer: BC Managed Care – PPO | Admitting: Physical Therapy

## 2021-04-16 DIAGNOSIS — R293 Abnormal posture: Secondary | ICD-10-CM

## 2021-04-16 DIAGNOSIS — M542 Cervicalgia: Secondary | ICD-10-CM

## 2021-04-16 DIAGNOSIS — M6281 Muscle weakness (generalized): Secondary | ICD-10-CM

## 2021-04-16 NOTE — Therapy (Signed)
[place] ?[address] ?[place], [place], [address] ?Phone: [phone]   Fax:  [phone] ? ?Physical Therapy Treatment ? ?Patient Details  ?Name: Kaylee Guerrero ?MRN: [id number] ?Date of Birth: [birth year] ?Referring Provider (PT): Coletta Memos ? ? ?Encounter Date: 04/16/2021 ? ? PT End of Session - 04/16/21 0845   ? ? Visit Number 11   ? Date for PT Re-Evaluation 05/07/21   ? PT Start Time [phone]   ? PT Stop Time [phone]   ? PT Time Calculation (min) 43 min   ? Activity Tolerance Patient tolerated treatment well   ? Behavior During Therapy [facility] for tasks assessed/performed   ? ?  ?  ? ?  ? ? ?Past Medical History:  ?Diagnosis Date  ? Stroke [facility])   ? ? ?History reviewed. No pertinent surgical history. ? ?There were no vitals filed for this visit. ? ? Subjective Assessment - 04/16/21 0759   ? ? Subjective Patient arrives from work very tired, but reports that the numbness is much improved. She still wakes up at times with numbness. She has been doing a lot of yard work in addition to her job and is sore all over.   ? Currently in Pain? No/denies   ? ?  ?  ? ?  ? ? ? ? ? [facility] PT Assessment - 04/16/21 0001   ? ?  ? Strength  ? Right Shoulder Flexion 4+/5   ? Right Shoulder Extension 4-/5   ? Right Shoulder ABduction 4/5   ? Right Shoulder Internal Rotation 4+/5   ? Right Shoulder External Rotation 4+/5   ? Left Shoulder Flexion 4-/5   ? Left Shoulder Extension 4-/5   ? Left Shoulder ABduction 4-/5   ? Left Shoulder Internal Rotation 4+/5   ? Left Shoulder External Rotation 4+/5   ? Right Elbow Flexion 4+/5   ? Right Elbow Extension 4-/5   ? Left Elbow Flexion 4/5   ? Left Elbow Extension 4-/5   ? ?  ?  ? ?  ? ? ? ? ? ? ? ? ? ? ? ? ? ? ? ? [facility] Adult PT Treatment/Exercise - 04/16/21 0001   ? ?  ? Neck Exercises: Standing  ? Wall Wash Back to wall with chin tuck, angel wings x 5.   ? Other Standing Exercises rows and ext with green tband, 2 x 10 reps   ?  ? Neck  Exercises: Seated  ? Other Seated Exercise ER Green 2x10   ?  ? Neck Exercises: Prone  ? Other Prone Exercise Quadruped single arm reach, single leg reach, bird dog, 2 reps each with hold x 5 sec.   ?  ? Neck Exercises: Stretches  ? Other Neck Stretches supine over bolster, first horizontal to roll thoracic spine, then vertical with angel wings.   ?  ? Manual Therapy  ? Manual Therapy Soft tissue mobilization;Passive ROM;Manual Traction   ? Joint Mobilization first rib mobs L   ? Soft tissue mobilization LUT and scalenes after DN   ? Manual Traction cervical spine 60 sec   ? ?  ?  ? ?  ? ? ? Trigger Point Dry Needling - 04/16/21 0001   ? ? Consent Given? Yes   ? Upper Trapezius Response Twitch reponse elicited;Palpable increased muscle length   ? ?  ?  ? ?  ? ? ? ? ? ? ? ? ? ? PT Short Term Goals -  04/12/21 0848   ? ?  ? PT SHORT TERM GOAL #1  ? Status Achieved   ?  ? PT SHORT TERM GOAL #2  ? Title Pain and numbness in LUE to have improved by 25% to show improvement in condition   ? Baseline Patient reports less numbness in L fingers.   ? Status Achieved   ?  ? PT SHORT TERM GOAL #3  ? Title Will have good understanding of posture and desk ergonomics   ? Status Achieved   ?  ? PT SHORT TERM GOAL #4  ? Title Will report 25% reduction in symptoms and when reaching overhead   ? Status Achieved   ? ?  ?  ? ?  ? ? ? ? PT Long Term Goals - 04/16/21 0829   ? ?  ? PT LONG TERM GOAL #1  ? Title MMT to improve by at least  1 grade in all weak groups to show improving functional strength   ? Time 3   ? Period Weeks   ? Status On-going   ? Target Date 05/07/21   ?  ? PT LONG TERM GOAL #2  ? Title Pain and symptoms in L UE to have improved by at least 50% to show improvement in condition   ? Time 2   ? Period Weeks   ? Status Achieved   ?  ? PT LONG TERM GOAL #3  ? Title Will be able to lift at least 30-35# overhead without increase in pain   ? Time 2   ? Period Weeks   ? Status Achieved   ?  ? PT LONG TERM GOAL #4  ? Title Pain  to be no more than 2/10 in order to improve QOL, work task tolerance, and sleep patterns   ? Baseline Pain and numbness much improved but still occasionally wakes her at night.   ? Time 3   ? Status On-going   ? Target Date 05/07/21   ? ?  ?  ? ?  ? ? ? ? ? ? ? ? Plan - 04/16/21 0846   ? ? Clinical Impression Statement patient reports continued imporvement in her pain and numbness, but she still wakes up at night sometimes due to the pain and numbness. She is responding well to treatment activities, including DN, stretching, and now adding postural strengthening as well. Will bneefit from several more treatments to continue progress.   ? Personal Factors and Comorbidities Fitness;Comorbidity 1;Profession;Time since onset of injury/illness/exacerbation   ? Comorbidities h/o cva   ? Examination-Activity Limitations Reach Overhead;Bed Mobility;Self Feeding;Caring for Others;Sleep;Dressing;Hygiene/Grooming;Toileting;Lift   ? Examination-Participation Restrictions Occupation;Cleaning;Community Activity;Driving;Laundry;Pincus Badder Work   ? Stability/Clinical Decision Making Evolving/Moderate complexity   ? Clinical Decision Making Moderate   ? Rehab Potential Good   ? PT Frequency 2x / week   ? PT Duration Other (comment)   5w  ? PT Treatment/Interventions ADLs/Self Care Home Management;Cryotherapy;Electrical Stimulation;Iontophoresis 4mg /ml Dexamethasone;Moist Heat;Traction;Ultrasound;Functional mobility training;Therapeutic activities;Therapeutic exercise;Balance training;Neuromuscular re-education;Patient/family education;Manual techniques;Scar mobilization;Passive range of motion;Dry needling;Energy conservation;Taping;Visual/perceptual remediation/compensation   ? PT Next Visit Plan Further DN? Assess tolerance to HEP   ? PT Home Exercise Plan   ? Consulted and Agree with Plan of Care Patient   ? ?  ?  ? ?  ? ? ?Patient will benefit from skilled therapeutic intervention in order to improve the following deficits  and impairments:  Increased fascial restricitons, Increased muscle spasms, Impaired UE functional use, Decreased activity tolerance, Pain, Impaired  flexibility, Improper body mechanics, Decreased strength, Increased edema, Impaired sensation, Postural dysfunction ? ?Visit Diagnosis: ?Cervicalgia ? ?Muscle weakness (generalized) ? ?Abnormal posture ? ? ? ? ?Problem List ?There are no problems to display for this patient. ? ? ?Iona BeardSusan M Abdulahi Schor, DPT ?04/16/2021, 10:25 AM ? ?[place] [facility] ?[id number] ?[place], [place], [address] ?Phone: [phone]   Fax:  [phone] ? ?Name: Kaylee Guerrero ?MRN: [id number] ?Date of Birth: 10/18/1970 ? ? ? ?

## 2021-04-16 NOTE — Patient Instructions (Signed)
Access Code: [email or web address] ?URL: [email or web address] ?Date: 04/16/2021 ?Prepared by: Oley Balm ? ?Exercises ?- Seated Cervical Retraction  - 2 x daily - 7 x weekly - 1 sets - 10-15 reps - 3 hold ?- Seated Upper Trapezius Stretch  - 2 x daily - 7 x weekly - 1 sets - 3 reps - 30-60 hold ?- Standing Cervical Retraction with Sidebending  - 2 x daily - 7 x weekly - 1 sets - 10 reps - 1 hold ?- Seated Scapular Retraction  - 2 x daily - 7 x weekly - 1 sets - 10 reps - 3 hold ?- Supine Thoracic Mobilization Towel Roll Vertical with Arm Stretch  - 1 x daily - 7 x weekly - 1 reps - 60 hold ?- Shoulder Extension with Resistance  - 1 x daily - 7 x weekly - 2 sets - 10 reps ?- Standing Bilateral Low Shoulder Row with Anchored Resistance  - 1 x daily - 7 x weekly - 2 sets - 10 reps ?- Shoulder External Rotation and Scapular Retraction with Resistance  - 1 x daily - 7 x weekly - 2 sets - 10 reps ?- Quadruped Alternating Arm Lift  - 1 x daily - 7 x weekly - 1 sets - 5 reps ?- Quadruped Alternating Leg Extensions  - 1 x daily - 7 x weekly - 1 sets - 5 reps ?- Bird Dog  - 1 x daily - 7 x weekly - 1 sets - 5 reps ?

## 2021-04-18 ENCOUNTER — Ambulatory Visit: Payer: BC Managed Care – PPO | Admitting: Physical Therapy

## 2021-04-18 ENCOUNTER — Encounter: Payer: Self-pay | Admitting: Physical Therapy

## 2021-04-18 DIAGNOSIS — R293 Abnormal posture: Secondary | ICD-10-CM

## 2021-04-18 DIAGNOSIS — M542 Cervicalgia: Secondary | ICD-10-CM | POA: Diagnosis not present

## 2021-04-18 DIAGNOSIS — M6281 Muscle weakness (generalized): Secondary | ICD-10-CM

## 2021-04-18 NOTE — Therapy (Signed)
[facility] ?[place] [address] ?[place], [place], [address] ?Phone: [phone]   Fax:  [phone] ? ?Physical Therapy Treatment ? ?Patient Details  ?Name: Kaylee Guerrero ?MRN: [id number] ?Date of Birth: September 06, 1970 ?Referring Provider (PT): Ashok Pall ? ? ?Encounter Date: 04/18/2021 ? ? PT End of Session - 04/18/21 0926   ? ? Visit Number 12   ? Date for PT Re-Evaluation 05/07/21   ? Authorization Time Period 03/06/21 to 04/17/21   ? PT Start Time 0845   ? PT Stop Time 0930   ? PT Time Calculation (min) 45 min   ? Activity Tolerance Patient tolerated treatment well   ? Behavior During Therapy [facility] for tasks assessed/performed   ? ?  ?  ? ?  ? ? ?Past Medical History:  ?Diagnosis Date  ? Stroke [facility])   ? ? ?History reviewed. No pertinent surgical history. ? ?There were no vitals filed for this visit. ? ? Subjective Assessment - 04/18/21 0845   ? ? Subjective Doing good still has a little bit of numbness in her fingers left.   ? Currently in Pain? No/denies   ? ?  ?  ? ?  ? ? ? ? ? ? ? ? ? ? ? ? ? ? ? ? ? ? ? ? [place] Adult PT Treatment/Exercise - 04/18/21 0001   ? ?  ? Neck Exercises: Machines for Strengthening  ? UBE (Upper Arm Bike) L2.3 x3 min foward and backward   ? Lat Pull 20#, 2 x 10 reps   ? Other Machines for Strengthening Rows 25lb 2x12 & Lats 25lb 2x10   ?  ? Neck Exercises: Standing  ? Neck Retraction 10 reps   x2 red band  ? Wall Push Ups 10 reps   x2  ? Wall Wash Back to wall green 2r 2x10,   ? Other Standing Exercises Shoulder Ext 10lb 2x10   ? Other Standing Exercises Tricep Ext 20lb 2x10   ?  ? Neck Exercises: Prone  ? Other Prone Exercise Quadruped single  bird dog, 5 reps each with hold x 2 sec.   ?  ? Manual Therapy  ? Manual Therapy Soft tissue mobilization;Passive ROM;Manual Traction   ? Soft tissue mobilization LUT and scalenes after DN   ? Manual Traction cervical spine 60 sec   ? ?  ?  ? ?  ? ? ? ? ? ? ? ? ? ? ? ? PT Short Term Goals - 04/12/21  0848   ? ?  ? PT SHORT TERM GOAL #1  ? Status Achieved   ?  ? PT SHORT TERM GOAL #2  ? Title Pain and numbness in LUE to have improved by 25% to show improvement in condition   ? Baseline Patient reports less numbness in L fingers.   ? Status Achieved   ?  ? PT SHORT TERM GOAL #3  ? Title Will have good understanding of posture and desk ergonomics   ? Status Achieved   ?  ? PT SHORT TERM GOAL #4  ? Title Will report 25% reduction in symptoms and when reaching overhead   ? Status Achieved   ? ?  ?  ? ?  ? ? ? ? PT Long Term Goals - 04/16/21 0829   ? ?  ? PT LONG TERM GOAL #1  ? Title MMT to improve by at least  1 grade in all weak groups to show improving functional strength   ?  Time 3   ? Period Weeks   ? Status On-going   ? Target Date 05/07/21   ?  ? PT LONG TERM GOAL #2  ? Title Pain and symptoms in L UE to have improved by at least 50% to show improvement in condition   ? Time 2   ? Period Weeks   ? Status Achieved   ?  ? PT LONG TERM GOAL #3  ? Title Will be able to lift at least 30-35# overhead without increase in pain   ? Time 2   ? Period Weeks   ? Status Achieved   ?  ? PT LONG TERM GOAL #4  ? Title Pain to be no more than 2/10 in order to improve QOL, work task tolerance, and sleep patterns   ? Baseline Pain and numbness much improved but still occasionally wakes her at night.   ? Time 3   ? Status On-going   ? Target Date 05/07/21   ? ?  ?  ? ?  ? ? ? ? ? ? ? ? Plan - 04/18/21 0926   ? ? Clinical Impression Statement PT enters clinic feeling fine with no pain. Cues needed to retract shoulders with seated rows and standing shoulder extensions. Pt did have some ore weakness with bird dogs evident by visual shaking. PT reports DN helping the most but declined today.   ? Personal Factors and Comorbidities Fitness;Comorbidity 1;Profession;Time since onset of injury/illness/exacerbation   ? Examination-Activity Limitations Reach Overhead;Bed Mobility;Self Feeding;Caring for  Others;Sleep;Dressing;Hygiene/Grooming;Toileting;Lift   ? Examination-Participation Restrictions Occupation;Cleaning;Community Activity;Driving;Laundry;[employer] Work   ? Stability/Clinical Decision Making Evolving/Moderate complexity   ? Rehab Potential Good   ? PT Frequency 2x / week   ? PT Treatment/Interventions ADLs/Self Care Home Management;Cryotherapy;Electrical Stimulation;Iontophoresis 4mg /ml Dexamethasone;Moist Heat;Traction;Ultrasound;Functional mobility training;Therapeutic activities;Therapeutic exercise;Balance training;Neuromuscular re-education;Patient/family education;Manual techniques;Scar mobilization;Passive range of motion;Dry needling;Energy conservation;Taping;Visual/perceptual remediation/compensation   ? PT Next Visit Plan Further DN? progress as tolerated   ? ?  ?  ? ?  ? ? ?Patient will benefit from skilled therapeutic intervention in order to improve the following deficits and impairments:  Increased fascial restricitons, Increased muscle spasms, Impaired UE functional use, Decreased activity tolerance, Pain, Impaired flexibility, Improper body mechanics, Decreased strength, Increased edema, Impaired sensation, Postural dysfunction ? ?Visit Diagnosis: ?Abnormal posture ? ?Muscle weakness (generalized) ? ?Cervicalgia ? ? ? ? ?Problem List ?There are no problems to display for this patient. ? ? ?Scot Jun, PTA ?04/18/2021, 9:28 AM ? ?[place] ?[place] ?[place], [place], [address] ?Phone: [phone]   Fax:  [phone] ? ?Name: Kaylee Guerrero ?MRN: [id number] ?Date of Birth: August 25, 1970 ? ? ? ?

## 2021-04-23 ENCOUNTER — Ambulatory Visit: Payer: BC Managed Care – PPO | Admitting: Physical Therapy

## 2021-04-25 ENCOUNTER — Ambulatory Visit: Payer: BC Managed Care – PPO | Admitting: Physical Therapy

## 2021-04-25 DIAGNOSIS — M542 Cervicalgia: Secondary | ICD-10-CM | POA: Diagnosis not present

## 2021-04-25 DIAGNOSIS — M6281 Muscle weakness (generalized): Secondary | ICD-10-CM

## 2021-04-25 NOTE — Therapy (Signed)
[facility] ?[place] [address] ?[place], [place], [address] ?Phone: [phone]   Fax:  [phone] ? ?Physical Therapy Treatment ? ?Patient Details  ?Name: Kaylee Guerrero ?MRN: [id number] ?Date of Birth: September 30, 1970 ?Referring Provider (PT): Ashok Pall ? ? ?Encounter Date: 04/25/2021 ? ? PT End of Session - 04/25/21 2595   ? ? Visit Number 13   ? Date for PT Re-Evaluation 05/07/21   ? Authorization Type BCBS   ? Authorization Time Period 03/06/21 to 04/17/21   ? PT Start Time 0845   ? PT Stop Time 6387   ? PT Time Calculation (min) 50 min   ? ?  ?  ? ?  ? ? ?Past Medical History:  ?Diagnosis Date  ? Stroke [facility])   ? ? ?No past surgical history on file. ? ?There were no vitals filed for this visit. ? ? Subjective Assessment - 04/25/21 0848   ? ? Subjective just numbness in 3rd and 4th finger tips left hand. neck good   ? Currently in Pain? No/denies   ? ?  ?  ? ?  ? ? ? ? ? ? ? ? ? ? ? ? ? ? ? ? ? ? ? ? [place] Adult PT Treatment/Exercise - 04/25/21 0001   ? ?  ? Neck Exercises: Machines for Strengthening  ? UBE (Upper Arm Bike) L3.3 x3 min foward and backward   ?  ? Neck Exercises: Standing  ? Other Standing Exercises 2# 4 pt rhy stab 10 x   ? Other Standing Exercises 2# PNF  10 x   10# shld ext and row on cable pulley 15 x each  ?  ? Neck Exercises: Seated  ? Other Seated Exercise 4# row, horz abd and shld ext 2 sets 10   ?  ? Modalities  ? Modalities Moist Heat;Traction   ?  ? Moist Heat Therapy  ? Number Minutes Moist Heat 12 Minutes   ? Moist Heat Location Cervical   ?  ? Traction  ? Type of Traction Cervical   ? Max (lbs) 14   ? Time 12   ?  ? Manual Therapy  ? Manual Therapy Soft tissue mobilization   ? Soft tissue mobilization LUT and scalenes after DN   ? ?  ?  ? ?  ? ? ? Trigger Point Dry Needling - 04/25/21 0001   ? ? Consent Given? Yes   ? Upper Trapezius Response Twitch reponse elicited;Palpable increased muscle length   ? ?  ?  ? ?  ? ? ? ? ? ? ? ? ? ? PT  Short Term Goals - 04/12/21 0848   ? ?  ? PT SHORT TERM GOAL #1  ? Status Achieved   ?  ? PT SHORT TERM GOAL #2  ? Title Pain and numbness in LUE to have improved by 25% to show improvement in condition   ? Baseline Patient reports less numbness in L fingers.   ? Status Achieved   ?  ? PT SHORT TERM GOAL #3  ? Title Will have good understanding of posture and desk ergonomics   ? Status Achieved   ?  ? PT SHORT TERM GOAL #4  ? Title Will report 25% reduction in symptoms and when reaching overhead   ? Status Achieved   ? ?  ?  ? ?  ? ? ? ? PT Long Term Goals - 04/25/21 0852   ? ?  ?  PT LONG TERM GOAL #1  ? Title MMT to improve by at least  1 grade in all weak groups to show improving functional strength   ? Status Partially Met   ?  ? PT LONG TERM GOAL #2  ? Title Pain and symptoms in L UE to have improved by at least 50% to show improvement in condition   ? Baseline 90-95% better   ? Status Achieved   ?  ? PT LONG TERM GOAL #3  ? Title Will be able to lift at least 30-35# overhead without increase in pain   ? Status Achieved   ?  ? PT LONG TERM GOAL #4  ? Title Pain to be no more than 2/10 in order to improve QOL, work task tolerance, and sleep patterns   ? Baseline Pain and numbness much improved but still occasionally wakes her at night.   ? Status Partially Met   ? ?  ?  ? ?  ? ? ? ? ? ? ? ? Plan - 04/25/21 0922   ? ? Clinical Impression Statement pt arrived 90-95% better still numbness 3rd and 4th finger tip left hand and TP in BIL trap- states DN and traction have helped the most. progressed scap strengthening with postural cuing. progressing with LTGs   ? PT Treatment/Interventions ADLs/Self Care Home Management;Cryotherapy;Electrical Stimulation;Iontophoresis 75m/ml Dexamethasone;Moist Heat;Traction;Ultrasound;Functional mobility training;Therapeutic activities;Therapeutic exercise;Balance training;Neuromuscular re-education;Patient/family education;Manual techniques;Scar mobilization;Passive range of  motion;Dry needling;Energy conservation;Taping;Visual/perceptual remediation/compensation   ? PT Next Visit Plan Further DN? progress as tolerated   ? ?  ?  ? ?  ? ? ?Patient will benefit from skilled therapeutic intervention in order to improve the following deficits and impairments:  Increased fascial restricitons, Increased muscle spasms, Impaired UE functional use, Decreased activity tolerance, Pain, Impaired flexibility, Improper body mechanics, Decreased strength, Increased edema, Impaired sensation, Postural dysfunction ? ?Visit Diagnosis: ?Muscle weakness (generalized) ? ?Cervicalgia ? ? ? ? ?Problem List ?There are no problems to display for this patient. ? ? ?Jerimiah Wolman,ANGIE, PTA ?04/25/2021, 9:51 AM ? ?[place] ?[address] ?[place] [address]?Phone: [phone]  Fax:  [phone]? ?Name: CELYNA PANGILINAN?MRN: [id number]?Date of Birth: 617-Feb-1972? ? ? ?

## 2021-04-30 ENCOUNTER — Ambulatory Visit: Payer: BC Managed Care – PPO | Admitting: Physical Therapy

## 2021-05-02 ENCOUNTER — Encounter: Payer: Self-pay | Admitting: Physical Therapy

## 2021-05-02 ENCOUNTER — Ambulatory Visit: Payer: BC Managed Care – PPO | Admitting: Physical Therapy

## 2021-05-02 DIAGNOSIS — M542 Cervicalgia: Secondary | ICD-10-CM

## 2021-05-02 DIAGNOSIS — M6281 Muscle weakness (generalized): Secondary | ICD-10-CM

## 2021-05-02 NOTE — Therapy (Signed)
[facility] ?[place] [address] ?[place], [place], [address] ?Phone: [phone]   Fax:  [phone] ? ?Physical Therapy Treatment ? ?Patient Details  ?Name: Kaylee Guerrero ?MRN: [id number] ?Date of Birth: Jul 06, 1970 ?Referring Provider (PT): Ashok Pall ? ? ?Encounter Date: 05/02/2021 ? ? PT End of Session - 05/02/21 2694   ? ? Visit Number 14   ? Date for PT Re-Evaluation 05/07/21   ? PT Start Time 0845   ? PT Stop Time 8546   ? PT Time Calculation (min) 35 min   ? Activity Tolerance Patient limited by fatigue   ? Behavior During Therapy [facility] for tasks assessed/performed   ? ?  ?  ? ?  ? ? ?Past Medical History:  ?Diagnosis Date  ? Stroke [facility])   ? ? ?History reviewed. No pertinent surgical history. ? ?There were no vitals filed for this visit. ? ? Subjective Assessment - 05/02/21 0843   ? ? Subjective L side is doing great but he R side extremities are sore an weak. 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 Currently in Pain? Yes   ? Pain Score 7    ? Pain Location --   Quad and hamstring  ? Pain Orientation Right   ? ?  ?  ? ?  ? ? ? ? ? ? ? ? ? ? ? ? ? ? ? ? ? ? ? ? [place] Adult PT Treatment/Exercise - 05/02/21 0001   ? ?  ? Neck Exercises: Machines for Strengthening  ? UBE (Upper Arm Bike) L3.3 x2 min foward and backward   ? Nustep L5 x 5 min   ? Other Machines for Strengthening Rows 25lb 2x10 & Lats 25lb 2x10   ?  ? Neck Exercises: Standing  ? Other Standing Exercises Shoulder ER red 2x10, Shoulder Ext green 2x10   ? Other Standing Exercises D2 flex yellow 2x0 each   ?  ? Neck Exercises: Seated  ? Other Seated Exercise 3# row, horz abd and shld ext 2 sets 10   ? ?  ?  ? ?  ? ? ? ? ? ? ? ? ? ? ? ? PT Short Term Goals - 04/12/21 0848   ? ?  ? PT SHORT TERM GOAL #1  ? Status Achieved   ?  ? PT SHORT TERM GOAL #2  ? Title Pain and numbness in LUE to have improved by 25% to show improvement in condition   ? Baseline Patient reports less  numbness in L fingers.   ? Status Achieved   ?  ? PT SHORT TERM GOAL #3  ? Title Will have good understanding of posture and desk ergonomics   ? Status Achieved   ?  ? PT SHORT TERM GOAL #4  ? Title Will report 25% reduction in symptoms and when reaching overhead   ? Status Achieved   ? ?  ?  ? ?  ? ? ? ? PT Long Term Goals - 04/25/21 0852   ? ?  ? PT LONG TERM GOAL #1  ? Title MMT to improve by at least  1 grade in all weak groups to show improving functional strength   ? Status Partially Met   ?  ? PT LONG TERM GOAL #2  ? Title Pain and symptoms in L UE to have improved by at least 50% to show improvement in condition   ? Baseline 90-95%  better   ? Status Achieved   ?  ? PT LONG TERM GOAL #3  ? Title Will be able to lift at least 30-35# overhead without increase in pain   ? Status Achieved   ?  ? PT LONG TERM GOAL #4  ? Title Pain to be no more than 2/10 in order to improve QOL, work task tolerance, and sleep patterns   ? Baseline Pain and numbness much improved but still occasionally wakes her at night.   ? Status Partially Met   ? ?  ?  ? ?  ? ? ? ? ? ? ? ? Plan - 05/02/21 0922   ? ? Clinical Impression Statement Pt enters with R side weakness from fatigue due to a previous stroke in the past.  Pt works late shifts making her tired. Shorten treatment tome to her symptoms. She did complete all interventions during session. L UE did appear to move more efficient than R during session. Postural cues required with shoulder Ext.   ? Personal Factors and Comorbidities Fitness;Comorbidity 1;Profession;Time since onset of injury/illness/exacerbation   ? Examination-Activity Limitations Reach Overhead;Bed Mobility;Self Feeding;Caring for Others;Sleep;Dressing;Hygiene/Grooming;Toileting;Lift   ? Examination-Participation Restrictions Occupation;Cleaning;Community Activity;Driving;Laundry;[employer] Work   ? Stability/Clinical Decision Making Evolving/Moderate complexity   ? Rehab Potential Good   ? PT Frequency 2x / week   ? PT  Treatment/Interventions ADLs/Self Care Home Management;Cryotherapy;Electrical Stimulation;Iontophoresis 50m/ml Dexamethasone;Moist Heat;Traction;Ultrasound;Functional mobility training;Therapeutic activities;Therapeutic exercise;Balance training;Neuromuscular re-education;Patient/family education;Manual techniques;Scar mobilization;Passive range of motion;Dry needling;Energy conservation;Taping;Visual/perceptual remediation/compensation   ? ?  ?  ? ?  ? ? ?Patient will benefit from skilled therapeutic intervention in order to improve the following deficits and impairments:  Increased fascial restricitons, Increased muscle spasms, Impaired UE functional use, Decreased activity tolerance, Pain, Impaired flexibility, Improper body mechanics, Decreased strength, Increased edema, Impaired sensation, Postural dysfunction ? ?Visit Diagnosis: ?Muscle weakness (generalized) ? ?Cervicalgia ? ? ? ? ?Problem List ?There are no problems to display for this patient. ? ? ?RScot Jun PTA ?05/02/2021, 9:26 AM ? ?[place] ?[address] ?[place] [address]?Phone: [phone]  Fax:  [phone]? ?Name: CSHIKITA VAILLANCOURT?MRN: [id number]?Date of Birth: [birth year]? ? ? ?

## 2021-05-09 ENCOUNTER — Ambulatory Visit: Payer: BC Managed Care – PPO | Admitting: Physical Therapy

## 2021-05-16 ENCOUNTER — Encounter: Payer: Self-pay | Admitting: Physical Therapy

## 2021-05-16 ENCOUNTER — Ambulatory Visit: Payer: BC Managed Care – PPO | Attending: Neurosurgery | Admitting: Physical Therapy

## 2021-05-16 DIAGNOSIS — M6281 Muscle weakness (generalized): Secondary | ICD-10-CM | POA: Diagnosis present

## 2021-05-16 DIAGNOSIS — R293 Abnormal posture: Secondary | ICD-10-CM | POA: Diagnosis present

## 2021-05-16 DIAGNOSIS — M542 Cervicalgia: Secondary | ICD-10-CM | POA: Diagnosis present

## 2021-05-16 NOTE — Therapy (Signed)
[place] ?[address] ?[address] ?[place], [place], [address] ?Phone: [phone]   Fax:  [phone] ? ?Physical Therapy Treatment ? ?Patient Details  ?Name: Kaylee Guerrero ?MRN: [id number] ?Date of Birth: [birth year] ?Referring Provider (PT): Ashok Pall ? ? ?Encounter Date: 05/16/2021 ? ? PT End of Session - 05/16/21 0921   ? ? Visit Number 15   ? Date for PT Re-Evaluation 05/07/21   ? PT Start Time [id number]   ? PT Stop Time 0925   ? PT Time Calculation (min) 41 min   ? Activity Tolerance Patient tolerated treatment well   ? Behavior During Therapy [facility] for tasks assessed/performed   ? ?  ?  ? ?  ? ? ?Past Medical History:  ?Diagnosis Date  ? Stroke [facility])   ? ? ?History reviewed. No pertinent surgical history. ? ?There were no vitals filed for this visit. ? ? Subjective Assessment - 05/16/21 0844   ? ? Subjective Tired but good, still has a little numbness in her fingers but barley their. Goes back to the MD at the end of the month.   ? Currently in Pain? No/denies   ? Pain Location Hand   ? Pain Orientation Left   ? Pain Descriptors / Indicators Numbness   ? ?  ?  ? ?  ? ? ? ? ? ? ? ? ? ? ? ? ? ? ? ? ? ? ? ? [facility] PT Treatment/Exercise - 05/16/21 0001   ? ?  ? Neck Exercises: Machines for Strengthening  ? UBE (Upper Arm Bike) L3.3 x3 min foward and backward   ? Nustep L5 x 6 min   ? Other Machines for Strengthening Rows 25lb 2x12 & Lats 25lb 2x10   ?  ? Neck Exercises: Standing  ? Neck Retraction 20 reps;3 secs   ? Other Standing Exercises Shoulder ER & Horiz abd  red 2x10   ? Other Standing Exercises D2 flex red 2x10 each, Flex & abd 3lb 2x10   ? ?  ?  ? ?  ? ? ? ? ? ? ? ? ? ? ? ? PT Short Term Goals - 04/12/21 0848   ? ?  ? PT SHORT TERM GOAL #1  ? Status Achieved   ?  ? PT SHORT TERM GOAL #2  ? Title Pain and numbness in LUE to have improved by 25% to show improvement in condition   ? Baseline Patient reports less numbness in L fingers.   ? Status Achieved   ?  ?  PT SHORT TERM GOAL #3  ? Title Will have good understanding of posture and desk ergonomics   ? Status Achieved   ?  ? PT SHORT TERM GOAL #4  ? Title Will report 25% reduction in symptoms and when reaching overhead   ? Status Achieved   ? ?  ?  ? ?  ? ? ? ? PT Long Term Goals - 05/16/21 0903   ? ?  ? PT LONG TERM GOAL #1  ? Title MMT to improve by at least  1 grade in all weak groups to show improving functional strength   ? Status Partially Met   ?  ? PT LONG TERM GOAL #2  ? Title Pain and symptoms in L UE to have improved by at least 50% to show improvement in condition   ? Status Achieved   ?  ? PT LONG TERM GOAL #3  ? Title  Will be able to lift at least 30-35# overhead without increase in pain   ? Status Achieved   ?  ? PT LONG TERM GOAL #4  ? Title Pain to be no more than 2/10 in order to improve QOL, work task tolerance, and sleep patterns   ? Status Achieved   ? ?  ?  ? ?  ? ? ? ? ? ? ? ? Plan - 05/16/21 0921   ? ? Clinical Impression Statement Pt enters feeling fine overall. She still has complaints of slight numbness on the fourth and fifth digit of L hand. She has progressed netting LTGs. She was unable to attend her MD appointment last well but it has rescheduled it for the end of the month. She did well with all interventions without compensation. Bilateral UE fatigue reported with flexion and abduction. Pt reports that's he has a gym membership and will continue her care on her own in order to save some PT visits.   ? Personal Factors and Comorbidities Fitness;Comorbidity 1;Profession;Time since onset of injury/illness/exacerbation   ? Examination-Activity Limitations Reach Overhead;Bed Mobility;Self Feeding;Caring for Others;Sleep;Dressing;Hygiene/Grooming;Toileting;Lift   ? Examination-Participation Restrictions Occupation;Cleaning;Community Activity;Driving;Laundry;[employer] Work   ? Stability/Clinical Decision Making Evolving/Moderate complexity   ? Rehab Potential Good   ? PT Frequency 2x / week   ? PT  Treatment/Interventions ADLs/Self Care Home Management;Cryotherapy;Electrical Stimulation;Iontophoresis 71m/ml Dexamethasone;Moist Heat;Traction;Ultrasound;Functional mobility training;Therapeutic activities;Therapeutic exercise;Balance training;Neuromuscular re-education;Patient/family education;Manual techniques;Scar mobilization;Passive range of motion;Dry needling;Energy conservation;Taping;Visual/perceptual remediation/compensation   ? PT Next Visit Plan D/C PT   ? ?  ?  ? ?  ? ? ?Patient will benefit from skilled therapeutic intervention in order to improve the following deficits and impairments:  Increased fascial restricitons, Increased muscle spasms, Impaired UE functional use, Decreased activity tolerance, Pain, Impaired flexibility, Improper body mechanics, Decreased strength, Increased edema, Impaired sensation, Postural dysfunction ? ?Visit Diagnosis: ?Muscle weakness (generalized) ? ?Abnormal posture ? ?Cervicalgia ? ? ? ? ?Problem List ?There are no problems to display for this patient. ? ? ?PHYSICAL THERAPY DISCHARGE SUMMARY ? ?Visits from Start of Care: 15 ? ?Patient agrees to discharge. Patient goals were met. Patient is being discharged due to meeting the stated rehab goals. ? ?RScot Jun PTA ?05/16/2021, 9:26 AM ? ?[place] ?[address] ?[address] [place] [address]?Phone: [phone]  Fax:  [phone]? ?Name: Kaylee Guerrero?MRN: [id number]?Date of Birth: 6June 19, 1972? ? ? ?

## 2021-05-23 ENCOUNTER — Ambulatory Visit: Payer: BC Managed Care – PPO | Admitting: Physical Therapy

## 2021-05-30 ENCOUNTER — Ambulatory Visit: Payer: BC Managed Care – PPO | Admitting: Physical Therapy

## 2021-06-06 ENCOUNTER — Ambulatory Visit: Payer: BC Managed Care – PPO | Admitting: Physical Therapy

## 2021-06-13 ENCOUNTER — Ambulatory Visit: Payer: BC Managed Care – PPO | Admitting: Physical Therapy

## 2021-06-20 ENCOUNTER — Ambulatory Visit: Payer: BC Managed Care – PPO | Admitting: Physical Therapy

## 2023-11-24 ENCOUNTER — Emergency Department (HOSPITAL_COMMUNITY): Payer: Worker's Compensation

## 2023-11-24 ENCOUNTER — Emergency Department (HOSPITAL_COMMUNITY)
Admission: EM | Admit: 2023-11-24 | Discharge: 2023-11-24 | Disposition: A | Discharge status: Home / Self Care | Payer: Worker's Compensation | Source: Ambulatory Visit | Attending: Emergency Medicine | Admitting: Emergency Medicine

## 2023-11-24 DIAGNOSIS — S0990XA Unspecified injury of head, initial encounter: Secondary | ICD-10-CM

## 2023-11-24 DIAGNOSIS — W228XXA Striking against or struck by other objects, initial encounter: Secondary | ICD-10-CM | POA: Diagnosis not present

## 2023-11-24 DIAGNOSIS — S060X0A Concussion without loss of consciousness, initial encounter: Secondary | ICD-10-CM | POA: Insufficient documentation

## 2023-11-24 DIAGNOSIS — Y990 Civilian activity done for income or pay: Secondary | ICD-10-CM | POA: Insufficient documentation

## 2023-11-24 DIAGNOSIS — Z8673 Personal history of transient ischemic attack (TIA), and cerebral infarction without residual deficits: Secondary | ICD-10-CM | POA: Insufficient documentation

## 2023-11-24 MED ORDER — ACETAMINOPHEN 325 MG PO TABS
650.0000 mg | ORAL_TABLET | Freq: Once | ORAL | Status: AC
  Administered 2023-11-24: 650 mg via ORAL
  Filled 2023-11-24: qty 2
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
## 2023-11-24 NOTE — ED Provider Notes (Signed)
 Kaylee Guerrero Provider Note   CSN: [id number] Arrival date & time: 11/24/23  1926     Patient presents with: Head Injury   Kaylee Guerrero is a 53 y.o. female.   Patient is a 53 year old female with a past medical history of CVA on 324 mg of aspirin daily presenting to the emergency department after head injury.  The patient states that Saturday while at work a heavy piece of equipment fell on top of her head.  She states that she did not pass out but did feel dizzy and saw stars.  She states that she has been having severe frontal headache since then that is worse when she bends her head down.  She states that she has photophobia.  Denies any vision changes.  She states that she has had intermittent numbness of the right side of her lip, denies any weakness.  She denies any nausea or vomiting.  She was seen at urgent care who recommended she come to the ED for CT evaluation.  The history is provided by the patient.  Head Injury      Prior to Admission medications   Not on File    Allergies: Codeine, Penicillins, and Shellfish allergy    Review of Systems  Updated Vital Signs BP (!) 172/95 (BP Location: Right Arm)   Pulse 72   Temp 97.9 F (36.6 C) (Oral)   Resp 16   Ht 5' 6 (1.676 m)   Wt 72.6 kg   SpO2 99%   BMI 25.82 kg/m   Physical Exam Vitals and nursing note reviewed.  Constitutional:      General: She is not in acute distress.    Appearance: Normal appearance.  HENT:     Head: Normocephalic and atraumatic.     Nose: Nose normal.     Mouth/Throat:     Mouth: Mucous membranes are moist.     Pharynx: Oropharynx is clear.  Eyes:     Extraocular Movements: Extraocular movements intact.     Conjunctiva/sclera: Conjunctivae normal.     Pupils: Pupils are equal, round, and reactive to light.  Cardiovascular:     Rate and Rhythm: Normal rate and regular rhythm.     Heart sounds: Normal heart sounds.   Pulmonary:     Effort: Pulmonary effort is normal.     Breath sounds: Normal breath sounds.  Abdominal:     General: Abdomen is flat.     Palpations: Abdomen is soft.     Tenderness: There is no abdominal tenderness.  Musculoskeletal:        General: Normal range of motion.     Cervical back: Normal range of motion.  Skin:    General: Skin is warm and dry.  Neurological:     General: No focal deficit present.     Mental Status: She is alert and oriented to person, place, and time.     Cranial Nerves: No cranial nerve deficit.     Sensory: No sensory deficit.     Motor: No weakness.  Psychiatric:        Mood and Affect: Mood normal.        Behavior: Behavior normal.     (all labs ordered are listed, but only abnormal results are displayed) Labs Reviewed - No data to display  EKG: None  Radiology: CT Head Wo Contrast Result Date: 11/24/2023 CLINICAL DATA:  Status post trauma. EXAM: CT HEAD WITHOUT CONTRAST TECHNIQUE: Contiguous axial images  were obtained from the base of the skull through the vertex without intravenous contrast. RADIATION DOSE REDUCTION: This exam was performed according to the departmental dose-optimization program which includes automated exposure control, adjustment of the mA and/or kV according to patient size and/or use of iterative reconstruction technique. COMPARISON:  February 13, 2021 FINDINGS: Brain: No evidence of acute infarction, hemorrhage, hydrocephalus, extra-axial collection or mass lesion/mass effect. Chronic left basal ganglia infarct is seen with subsequent ex vacuo dilatation of the anterior horn of the left lateral ventricle. Vascular: No hyperdense vessel or unexpected calcification. Skull: Normal. Negative for fracture or focal lesion. Sinuses/Orbits: No acute finding. Other: None. IMPRESSION: 1. No acute intracranial abnormality. 2. Chronic left basal ganglia infarct. Electronically Signed   By: Suzen Dials M.D.   On: 11/24/2023 21:35      Procedures   Medications Ordered in the ED  acetaminophen (TYLENOL) tablet 650 mg (650 mg Oral Given 11/24/23 2138)                                    Medical Decision Making This patient presents to the ED with chief complaint(s) of head injury with pertinent past medical history of CVA which further complicates the presenting complaint. The complaint involves an extensive differential diagnosis and also carries with it a high risk of complications and morbidity.    The differential diagnosis includes ICH, mass effect, skull fracture, contusion, concussion  Additional history obtained: Additional history obtained from N/A Records reviewed N/A  ED Course and Reassessment: On patient's arrival she is mildly hypertensive but otherwise hemodynamically stable in no acute distress, does not currently have any neurologic deficits.  She had head CT ordered in triage that is pending at this time.  She is given Tylenol for her headache and will be closely reassessed.  Independent labs interpretation:  N/A  Independent visualization of imaging: - I independently visualized the following imaging with scope of interpretation limited to determining acute life threatening conditions related to emergency care: CTH, which revealed no acute disease  Consultation: - Consulted or discussed management/test interpretation w/ external professional: N/A  Consideration for admission or further workup: Patient has no emergent conditions requiring admission or further work-up at this time and is stable for discharge home with primary care follow-up  Social Determinants of health: N/A    Amount and/or Complexity of Data Reviewed Radiology: ordered.  Risk OTC drugs.       Final diagnoses:  Injury of head, initial encounter  Concussion without loss of consciousness, initial encounter    ED Discharge Orders     None          [name], DO 11/24/23 2226

## 2023-11-24 NOTE — Discharge Instructions (Signed)
 You were seen in the emergency department for your head injury.  Your workup showed no bleeding in the brain and no broken bones.  You likely have a concussion.  You can continue to take Tylenol and Motrin as needed for headaches.  You should make sure that you are getting plenty of rest and limiting screen time to help your brain recover.  You should follow-up with your primary doctor in the next few days to have your symptoms rechecked and if you are continuing to have symptoms after a week can follow-up in the concussion clinic.  You should return to the emergency department for significantly worsening headache, repetitive vomiting, numbness or weakness in one-sided body compared to the other, seizures or any other new or concerning symptoms.

## 2024-01-10 IMAGING — MR MR HEAD W/O CM
9 of 10 series · 39 of 48 positions shown · non-contrast
Comparison: CT head from the same day.  MRI May 03, 2010.

CLINICAL DATA: Neuro deficit, acute, stroke suspected

EXAM:
MRI HEAD WITHOUT CONTRAST
TECHNIQUE: Multiplanar, multiecho pulse sequences of the brain and surrounding
structures were obtained without intravenous contrast.

[Series 3: DWI · axial · 3.0mm · 1.09mm/px · z∈[-64,+82]mm · 11 of 100 slices shown (1 of 4)]
[im 1/100]
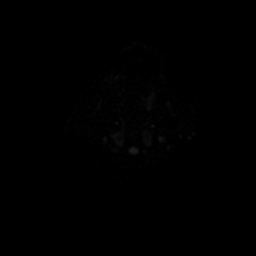
[im 10/100]
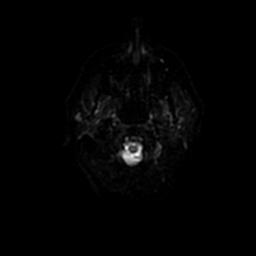
[im 20/100]
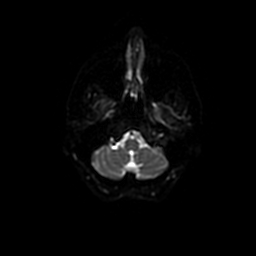
[im 30/100]
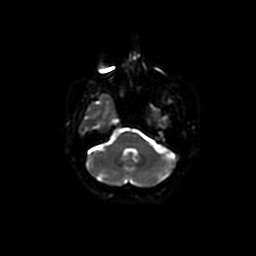
[im 40/100]
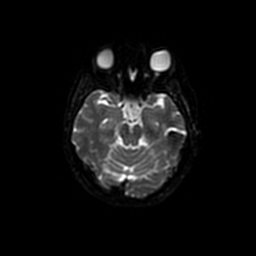
[im 50/100]
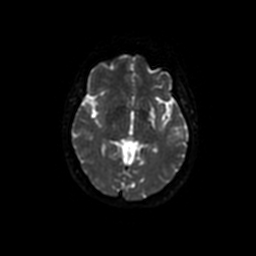
[im 60/100]
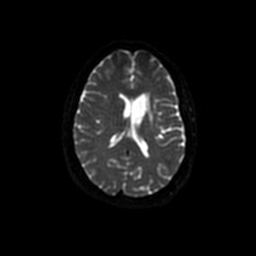
[im 70/100]
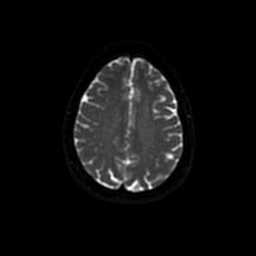
[im 80/100]
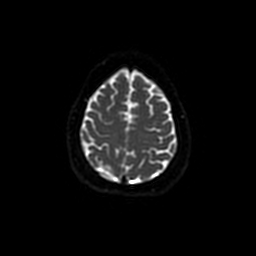
[im 90/100]
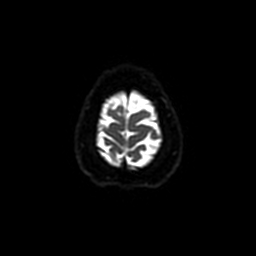
[im 100/100]
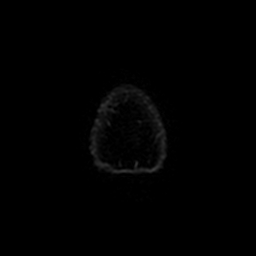

[Series 4: DWI · coronal · 5.0mm · 1.09mm/px · 8 of 72 slices shown (2 of 4)]
[im 1/72]
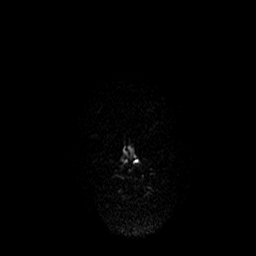
[im 11/72]
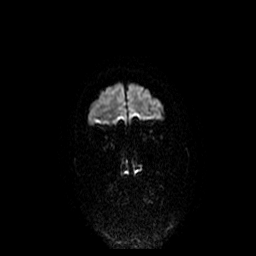
[im 21/72]
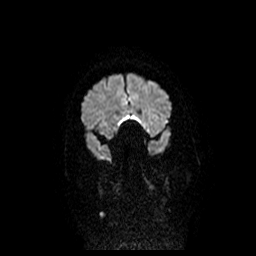
[im 31/72]
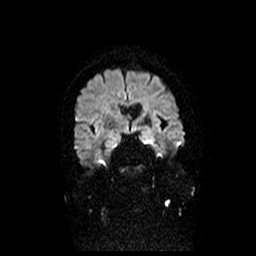
[im 41/72]
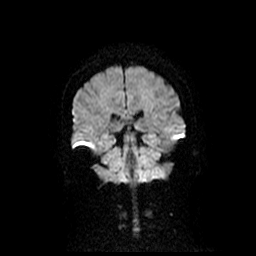
[im 51/72]
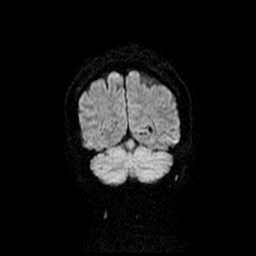
[im 61/72]
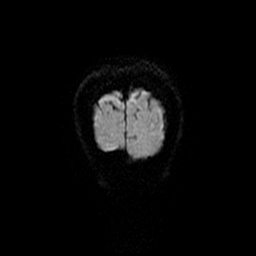
[im 72/72]
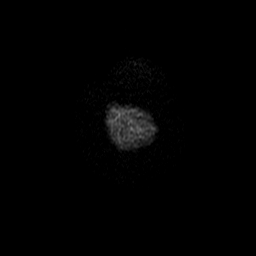

[Series 5: T1 · sagittal · 5.0mm · 0.47mm/px · 2 of 23 slices shown (1 of 2)]
[im 1/23]
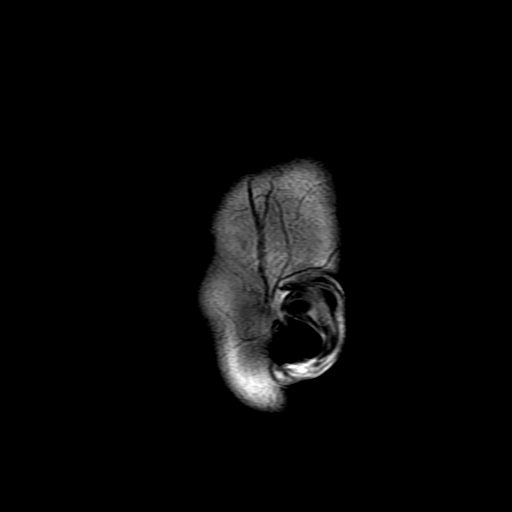
[im 23/23]
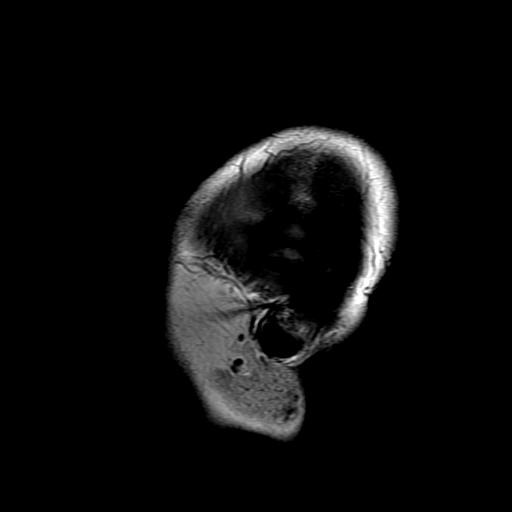

[Series 6: T2 · axial · 5.0mm · 0.43mm/px · z∈[-64,+73]mm · 2 of 24 slices shown (1 of 2)]
[im 1/24]
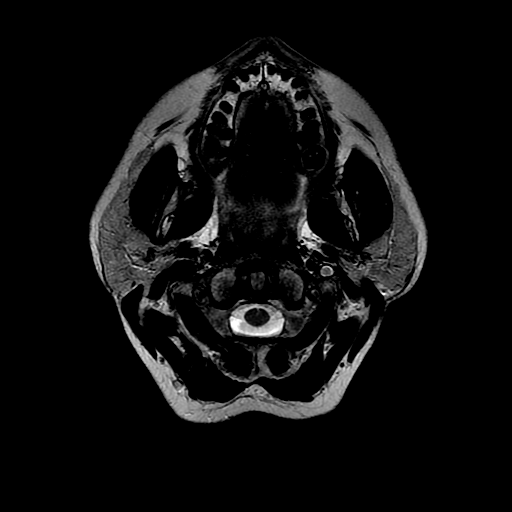
[im 24/24]
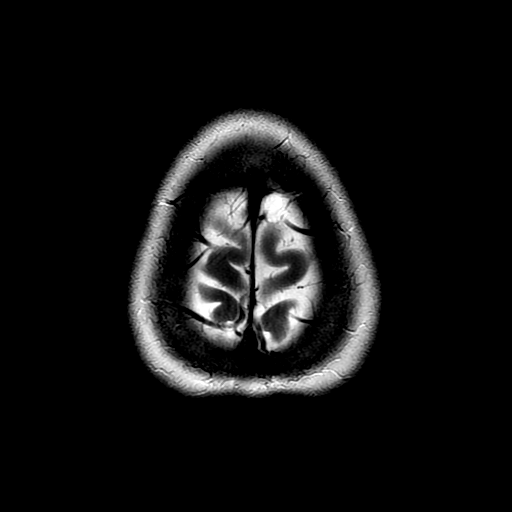

[Series 7: FLAIR · axial · 3.0mm · 0.43mm/px · z∈[-64,+73]mm · 2 of 24 slices shown]
[im 1/24]
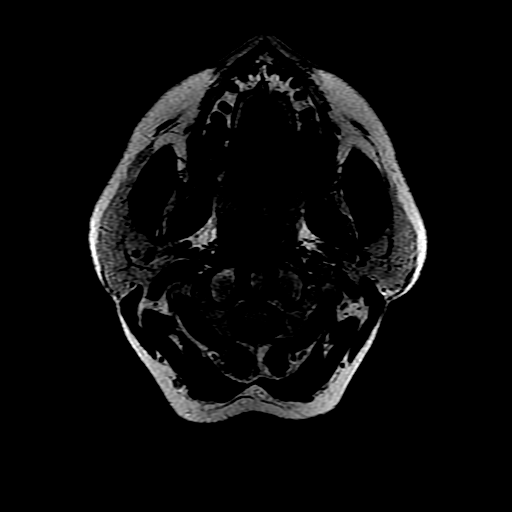
[im 24/24]
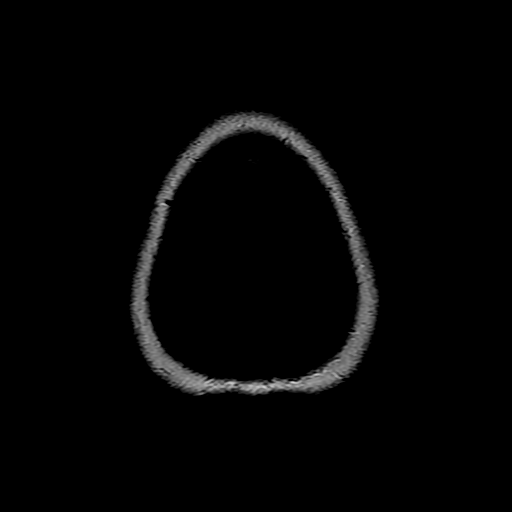

[Series 10: T1 · axial · 3.0mm · 0.43mm/px · z∈[-69,-19]mm · 3 of 100 slices shown (2 of 2)]
[im 1/100]
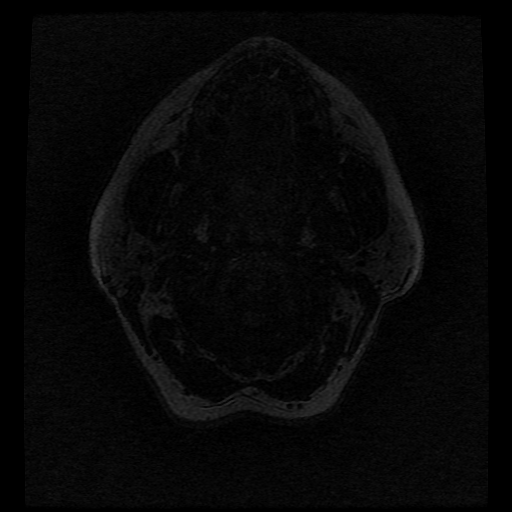
[im 12/100]
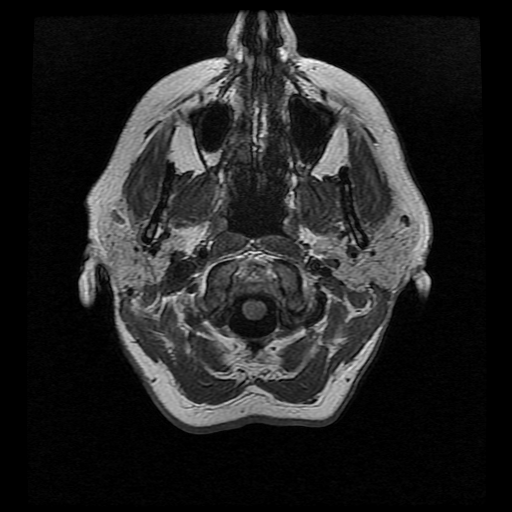
[im 34/100]
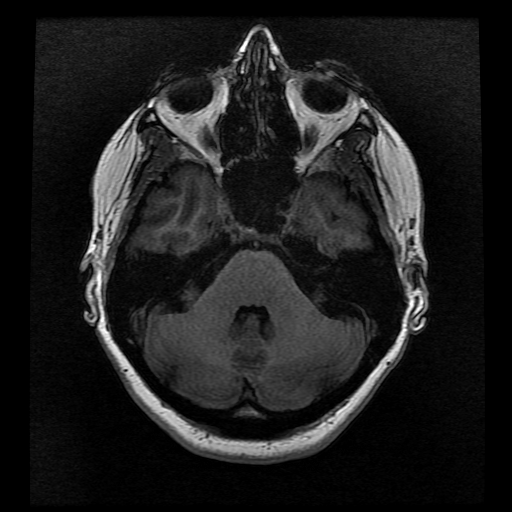

[Series 11: T2 · coronal · 5.0mm · 0.39mm/px · 2 of 24 slices shown (2 of 2)]
[im 1/24]
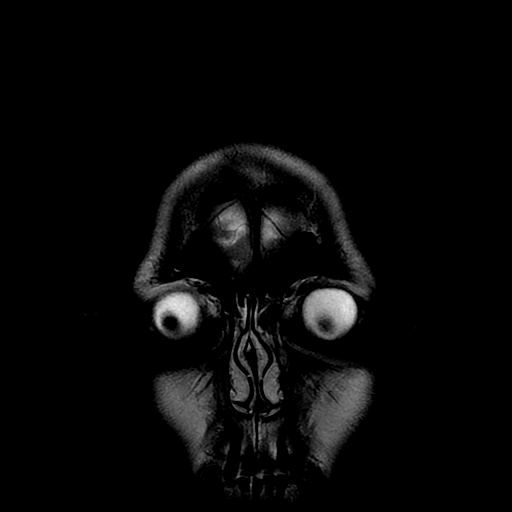
[im 24/24]
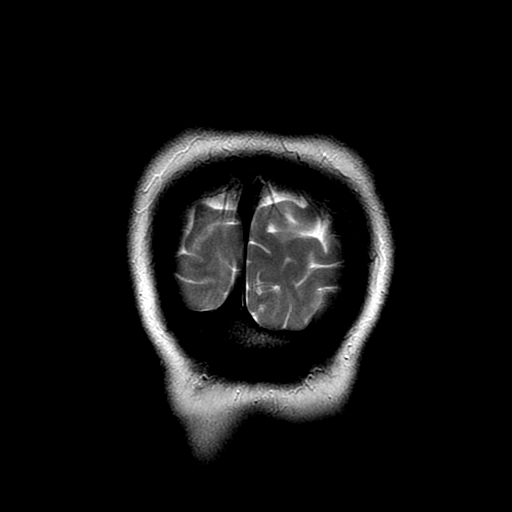

[Series 300: DWI · axial · 3.0mm · 1.09mm/px · z∈[-64,+82]mm · 5 of 50 slices shown (3 of 4)]
[im 1/50]
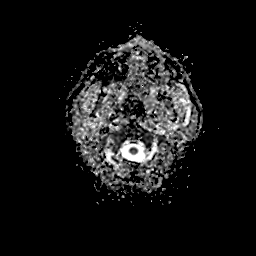
[im 13/50]
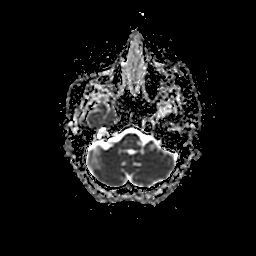
[im 25/50]
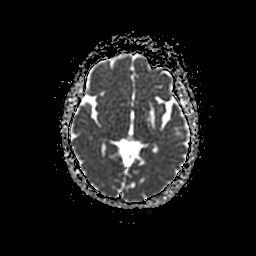
[im 37/50]
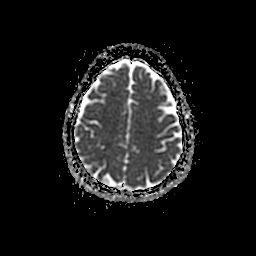
[im 50/50]
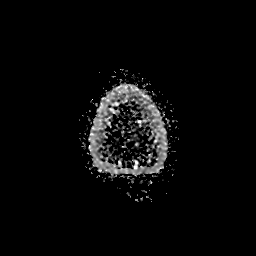

[Series 400: DWI · coronal · 5.0mm · 1.09mm/px · 4 of 36 slices shown (4 of 4)]
[im 1/36]
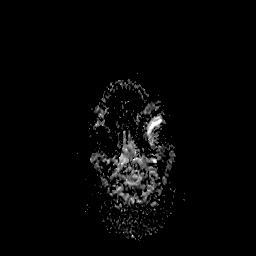
[im 12/36]
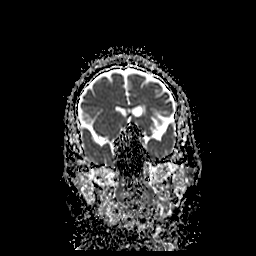
[im 24/36]
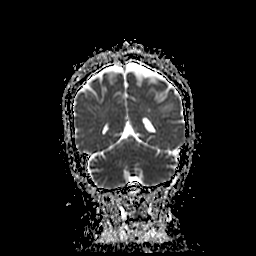
[im 36/36]
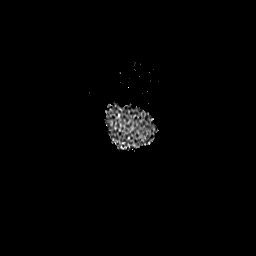

[39 of 48 positions shown; findings below may reference images not displayed]

FINDINGS: Brain: No acute infarction, hemorrhage, hydrocephalus, extra-axial
collection or mass lesion. Remote infarcts in the left basal ganglia
and left parietal lobe. Ex vacuo ventricular dilation involving the
frontal horn of the left lateral ventricle. Couple punctate T2/FLAIR
hyperintensities elsewhere in the brain, nonspecific but compatible
with chronic microvascular ischemic disease.

Vascular: Major arterial flow voids are maintained at the skull
base.

Skull and upper cervical spine: Normal marrow signal.

Sinuses/Orbits: Minimal paranasal sinus mucosal thickening.
Unremarkable orbits.

Other: No mastoid effusions.
IMPRESSION: 1. No evidence of acute intracranial abnormality.
2. Remote infarcts in the left basal ganglia and left parietal lobe.
